# Patient Record
Sex: Male | Born: 1955 | Race: White | Hispanic: No | Smoking: Never smoker
Health system: Southern US, Community
[De-identification: ages and names within clinical notes are randomized; demographics above are authoritative.]

## PROBLEM LIST (undated history)

## (undated) ENCOUNTER — Emergency Department (HOSPITAL_COMMUNITY): Disposition: A | Discharge status: Home / Self Care | Payer: Medicare Other

## (undated) DIAGNOSIS — R51 Headache: Secondary | ICD-10-CM

## (undated) DIAGNOSIS — I10 Essential (primary) hypertension: Secondary | ICD-10-CM

## (undated) DIAGNOSIS — F329 Major depressive disorder, single episode, unspecified: Secondary | ICD-10-CM

## (undated) DIAGNOSIS — R569 Unspecified convulsions: Secondary | ICD-10-CM

## (undated) DIAGNOSIS — F32A Depression, unspecified: Secondary | ICD-10-CM

## (undated) DIAGNOSIS — R519 Headache, unspecified: Secondary | ICD-10-CM

## (undated) DIAGNOSIS — Z87442 Personal history of urinary calculi: Secondary | ICD-10-CM

## (undated) DIAGNOSIS — K219 Gastro-esophageal reflux disease without esophagitis: Secondary | ICD-10-CM

## (undated) HISTORY — PX: JOINT REPLACEMENT: SHX530

## (undated) HISTORY — PX: ROTATOR CUFF REPAIR: SHX139

---

## 2002-06-30 ENCOUNTER — Encounter: Payer: Self-pay | Admitting: Emergency Medicine

## 2002-06-30 ENCOUNTER — Emergency Department (HOSPITAL_COMMUNITY): Admission: EM | Admit: 2002-06-30 | Discharge: 2002-06-30 | Payer: Self-pay | Admitting: Emergency Medicine

## 2002-07-12 ENCOUNTER — Encounter: Admission: RE | Admit: 2002-07-12 | Discharge: 2002-07-12 | Payer: Self-pay | Admitting: Internal Medicine

## 2002-07-12 ENCOUNTER — Encounter: Payer: Self-pay | Admitting: Internal Medicine

## 2002-08-16 ENCOUNTER — Encounter: Payer: Self-pay | Admitting: Emergency Medicine

## 2002-08-16 ENCOUNTER — Emergency Department (HOSPITAL_COMMUNITY): Admission: EM | Admit: 2002-08-16 | Discharge: 2002-08-16 | Payer: Self-pay | Admitting: Emergency Medicine

## 2003-11-27 ENCOUNTER — Ambulatory Visit (HOSPITAL_COMMUNITY): Admission: RE | Admit: 2003-11-27 | Discharge: 2003-11-27 | Payer: Self-pay | Admitting: Internal Medicine

## 2004-05-18 ENCOUNTER — Emergency Department (HOSPITAL_COMMUNITY): Admission: EM | Admit: 2004-05-18 | Discharge: 2004-05-18 | Payer: Self-pay | Admitting: Emergency Medicine

## 2004-09-24 ENCOUNTER — Ambulatory Visit (HOSPITAL_COMMUNITY): Admission: RE | Admit: 2004-09-24 | Discharge: 2004-09-24 | Payer: Self-pay | Admitting: Internal Medicine

## 2005-02-02 ENCOUNTER — Inpatient Hospital Stay (HOSPITAL_COMMUNITY): Admission: EM | Admit: 2005-02-02 | Discharge: 2005-02-08 | Payer: Self-pay | Admitting: Emergency Medicine

## 2005-02-05 ENCOUNTER — Ambulatory Visit: Payer: Self-pay | Admitting: Physical Medicine & Rehabilitation

## 2006-01-06 ENCOUNTER — Ambulatory Visit (HOSPITAL_COMMUNITY): Admission: RE | Admit: 2006-01-06 | Discharge: 2006-01-06 | Payer: Self-pay | Admitting: Internal Medicine

## 2006-03-23 ENCOUNTER — Emergency Department (HOSPITAL_COMMUNITY): Admission: EM | Admit: 2006-03-23 | Discharge: 2006-03-24 | Payer: Self-pay | Admitting: Emergency Medicine

## 2006-03-29 ENCOUNTER — Encounter: Admission: RE | Admit: 2006-03-29 | Discharge: 2006-03-29 | Payer: Self-pay | Admitting: Otolaryngology

## 2006-03-30 ENCOUNTER — Encounter: Admission: RE | Admit: 2006-03-30 | Discharge: 2006-03-30 | Payer: Self-pay | Admitting: Otolaryngology

## 2008-12-10 ENCOUNTER — Ambulatory Visit (HOSPITAL_COMMUNITY): Admission: RE | Admit: 2008-12-10 | Discharge: 2008-12-10 | Payer: Self-pay | Admitting: Internal Medicine

## 2009-02-01 ENCOUNTER — Emergency Department (HOSPITAL_COMMUNITY): Admission: EM | Admit: 2009-02-01 | Discharge: 2009-02-01 | Payer: Self-pay | Admitting: Emergency Medicine

## 2009-03-15 ENCOUNTER — Emergency Department (HOSPITAL_COMMUNITY): Admission: EM | Admit: 2009-03-15 | Discharge: 2009-03-15 | Payer: Self-pay | Admitting: Emergency Medicine

## 2009-12-01 ENCOUNTER — Emergency Department (HOSPITAL_COMMUNITY): Admission: EM | Admit: 2009-12-01 | Discharge: 2009-12-01 | Payer: Self-pay | Admitting: Emergency Medicine

## 2010-05-04 ENCOUNTER — Emergency Department (HOSPITAL_COMMUNITY): Admission: EM | Admit: 2010-05-04 | Discharge: 2010-05-04 | Payer: Self-pay | Admitting: Emergency Medicine

## 2010-11-28 ENCOUNTER — Emergency Department (HOSPITAL_COMMUNITY)
Admission: EM | Admit: 2010-11-28 | Discharge: 2010-11-28 | Payer: Self-pay | Source: Home / Self Care | Admitting: Emergency Medicine

## 2010-12-07 ENCOUNTER — Encounter (INDEPENDENT_AMBULATORY_CARE_PROVIDER_SITE_OTHER): Payer: Self-pay | Admitting: Internal Medicine

## 2010-12-07 LAB — CONVERTED CEMR LAB
Albumin: 4.3 g/dL (ref 3.5–5.2)
BUN: 7 mg/dL (ref 6–23)
CO2: 27 meq/L (ref 19–32)
Calcium: 9.4 mg/dL (ref 8.4–10.5)
Chloride: 97 meq/L (ref 96–112)
Glucose, Bld: 108 mg/dL — ABNORMAL HIGH (ref 70–99)
Hemoglobin: 16 g/dL (ref 13.0–17.0)
Potassium: 3.7 meq/L (ref 3.5–5.3)
RBC: 4.8 M/uL (ref 4.22–5.81)
WBC: 5 10*3/uL (ref 4.0–10.5)

## 2011-01-08 ENCOUNTER — Ambulatory Visit: Admit: 2011-01-08 | Payer: Self-pay | Admitting: Internal Medicine

## 2011-01-15 ENCOUNTER — Observation Stay (HOSPITAL_COMMUNITY)
Admission: EM | Admit: 2011-01-15 | Discharge: 2011-01-18 | Payer: Self-pay | Attending: Internal Medicine | Admitting: Internal Medicine

## 2011-01-15 ENCOUNTER — Emergency Department (HOSPITAL_BASED_OUTPATIENT_CLINIC_OR_DEPARTMENT_OTHER)
Admission: EM | Admit: 2011-01-15 | Discharge: 2011-01-15 | Disposition: A | Discharge status: Other Acute Inpatient Hospital | Payer: Self-pay | Source: Home / Self Care | Admitting: Emergency Medicine

## 2011-01-18 LAB — CBC
HCT: 41.6 % (ref 39.0–52.0)
Hemoglobin: 14.7 g/dL (ref 13.0–17.0)
MCH: 32.7 pg (ref 26.0–34.0)
MCHC: 35.3 g/dL (ref 30.0–36.0)
MCV: 92.7 fL (ref 78.0–100.0)
Platelets: 262 10*3/uL (ref 150–400)
RBC: 4.49 MIL/uL (ref 4.22–5.81)
RDW: 12.3 % (ref 11.5–15.5)
WBC: 10.3 10*3/uL (ref 4.0–10.5)

## 2011-01-18 LAB — POCT TOXICOLOGY PANEL: Opiates: POSITIVE

## 2011-01-18 LAB — DIFFERENTIAL
Basophils Absolute: 0 10*3/uL (ref 0.0–0.1)
Basophils Relative: 0 % (ref 0–1)
Eosinophils Absolute: 0.1 10*3/uL (ref 0.0–0.7)
Eosinophils Relative: 1 % (ref 0–5)
Lymphocytes Relative: 17 % (ref 12–46)
Lymphs Abs: 1.7 10*3/uL (ref 0.7–4.0)
Monocytes Absolute: 0.9 10*3/uL (ref 0.1–1.0)
Monocytes Relative: 9 % (ref 3–12)
Neutro Abs: 7.5 10*3/uL (ref 1.7–7.7)
Neutrophils Relative %: 73 % (ref 43–77)

## 2011-01-18 LAB — URINALYSIS, ROUTINE W REFLEX MICROSCOPIC
Hgb urine dipstick: NEGATIVE
Nitrite: NEGATIVE
Protein, ur: NEGATIVE mg/dL
Urobilinogen, UA: 0.2 mg/dL (ref 0.0–1.0)

## 2011-01-18 LAB — COMPREHENSIVE METABOLIC PANEL
ALT: 24 U/L (ref 0–53)
AST: 26 U/L (ref 0–37)
Albumin: 4.2 g/dL (ref 3.5–5.2)
Alkaline Phosphatase: 86 U/L (ref 39–117)
BUN: 24 mg/dL — ABNORMAL HIGH (ref 6–23)
CO2: 29 mEq/L (ref 19–32)
Calcium: 11.5 mg/dL — ABNORMAL HIGH (ref 8.4–10.5)
Chloride: 96 mEq/L (ref 96–112)
Creatinine, Ser: 1.3 mg/dL (ref 0.4–1.5)
GFR calc Af Amer: >60 mL/min (ref >60)
GFR calc non Af Amer: 58 mL/min — ABNORMAL LOW (ref >60)
Glucose, Bld: 137 mg/dL — ABNORMAL HIGH (ref 70–99)
Potassium: 3.9 mEq/L (ref 3.5–5.1)
Sodium: 136 mEq/L (ref 135–145)
Total Bilirubin: 1.8 mg/dL — ABNORMAL HIGH (ref 0.3–1.2)
Total Protein: 7.9 g/dL (ref 6.0–8.3)

## 2011-01-18 LAB — POCT CARDIAC MARKERS
CKMB, poc: 4.2 ng/mL (ref 1.0–8.0)
Myoglobin, poc: 217 ng/mL (ref 12–200)
Troponin i, poc: <0.05 ng/mL (ref 0.00–0.09)
Troponin i, poc: 0.06 ng/mL (ref 0.00–0.09)

## 2011-01-18 LAB — URINE MICROSCOPIC-ADD ON

## 2011-01-18 LAB — LIPASE, BLOOD: Lipase: 88 U/L (ref 23–300)

## 2011-01-19 LAB — MRSA PCR SCREENING: MRSA by PCR: NEGATIVE

## 2011-01-19 LAB — CARDIAC PANEL(CRET KIN+CKTOT+MB+TROPI)
CK, MB: 2.4 ng/mL (ref 0.3–4.0)
Relative Index: INVALID (ref 0.0–2.5)
Total CK: 109 U/L (ref 7–232)
Total CK: 89 U/L (ref 7–232)
Troponin I: 0.01 ng/mL (ref 0.00–0.06)
Troponin I: <0.01 ng/mL (ref 0.00–0.06)
Troponin I: 0.02 ng/mL (ref 0.00–0.06)

## 2011-01-19 LAB — BASIC METABOLIC PANEL
CO2: 28 mEq/L (ref 19–32)
Calcium: 8.4 mg/dL (ref 8.4–10.5)
Chloride: 103 mEq/L (ref 96–112)
Creatinine, Ser: 0.83 mg/dL (ref 0.4–1.5)
GFR calc Af Amer: >60 mL/min (ref >60)
GFR calc Af Amer: >60 mL/min (ref >60)
GFR calc non Af Amer: >60 mL/min (ref >60)
Potassium: 3.7 mEq/L (ref 3.5–5.1)
Potassium: 4 mEq/L (ref 3.5–5.1)
Sodium: 136 mEq/L (ref 135–145)
Sodium: 136 mEq/L (ref 135–145)

## 2011-01-19 LAB — LIPID PANEL
Total CHOL/HDL Ratio: 2.6 RATIO
VLDL: 8 mg/dL (ref 0–40)

## 2011-01-19 LAB — PTH, INTACT AND CALCIUM: Calcium, Total (PTH): 8.9 mg/dL (ref 8.4–10.5)

## 2011-01-19 LAB — ALKALINE PHOSPHATASE: Alkaline Phosphatase: 47 U/L (ref 39–117)

## 2011-01-19 LAB — PHOSPHORUS: Phosphorus: 3.6 mg/dL (ref 2.3–4.6)

## 2011-01-25 NOTE — Consult Note (Addendum)
NAMEMAGUIRE, SIME             ACCOUNT NO.:  [id number]  MEDICAL RECORD NO.:  [id number]          PATIENT TYPE:  INP  LOCATION:  2041                         FACILITY:  [facility]  PHYSICIAN:  Jesse Sans. Wall, MD, FACCDATE OF BIRTH:  11-13-1956  DATE OF CONSULTATION:  01/17/2011 DATE OF DISCHARGE:                                CONSULTATION   PRIMARY CARDIOLOGIST:  Rainey Pines. Daleen Squibb, MD, [facility]  PRIMARY CARE PHYSICIAN:  Dr. Su Hilt.  REASON FOR CONSULTATION:  Chest pain.  HISTORY OF PRESENT ILLNESS:  This is a 55 year old Caucasian male without prior cardiac history with a 4-day history of "knife-like pain" midsternal while at home, lasting 10 minutes at a time associated with hiccups and occasional vomiting.  He states the vomiting made the chest pain better.  The pain returned 3-4 times an hour, laying down made it worse.  The following day the patient had dyspnea with diaphoresis. That afternoon he felt some better, but when he laid down at night the pain returned this time with radiation into his back with hiccups and nausea and vomiting.  The patient states the pain worsened the following day and his son brought him to the emergency room at [facility].  He was given sublingual nitroglycerin and pain medicine and the pain did not completely go away.  Therefore, he was transferred to [facility] for further evaluation.  The patient states now that he has had no more back pain or hiccups, but he continues to have 7/10 chest pain.  REVIEW OF SYSTEMS:  Positive for chest pain, shortness of breath radiating to the back, knife-like with nausea, vomiting, and hiccups. All other systems were reviewed and found to be negative.  PAST MEDICAL HISTORY: 1. Hypercalcemia. 2. Hypertension. 3. Avascular necrosis of the right hip. 4. Arthritis of the lower back and hip on the right. 5. Kidney stones.  PAST SURGICAL HISTORY:  Left rotator cuff repair, 2 hip surgeries on the right, and  lithotripsy secondary to kidney stones.  SOCIAL HISTORY:  He does not smoke.  He drank heavily in the past but only drinks maybe once a week now.  No drug use.  He lives in [place] with his daughter.  He has 2 children.  FAMILY HISTORY:  Mother deceased from complications of an MI with a history of a CVA.  Father had an MI at age 45 but he was deceased from a CVA.  He has a brother with hypertension.  CURRENT MEDICATIONS AT HOME: 1. Hydrochlorothiazide 25 mg daily. 2. Citalopram 20 mg daily. 3. Trazodone 50 mg daily. 4. Morphine 30 mg b.i.d. p.r.n.  ALLERGIES:  No known drug allergies.  CURRENT LABORATORY DATA:  Sodium 134, potassium 4.0, chloride 103, CO2 26, BUN 14, creatinine 0.83, and glucose 103.  Hemoglobin 14.7, hematocrit 41.6, white blood cells 10.3, and platelets 262.  Total bili 1.8, alkaline phosphatase 47, AST 26, ALT 24, total protein 7.9, and albumin 4.2.  TSH 1.48.  MRSA negative.  Troponin 0.01, 0.02, and 0.01 respectively.  Calcium 11.5 with a phosphorus of 3.6.  RADIOLOGY:  Chest x-ray normal and stable.  CT scan of the chest  negative for PE, small bilateral pleural effusions with overlying compressive type atelectasis.  EKG revealing sinus tachycardia, rate of 115 beats per minute with no ST changes.  PHYSICAL EXAMINATION:  VITAL SIGNS:  Blood pressure 130/83, pulse 89, respirations 18, temperature 97.5, O2 sat 98% on room air. GENERAL:  He is awake, alert, and oriented.  No acute distress. HEENT:  Head is normocephalic and atraumatic.  Eyes:  PERRLA. NECK:  Supple without JVD or carotid bruits appreciated. CARDIOVASCULAR:  Tachycardic without rubs or gallops.  Pulses are 2+ and equal. LUNGS:  Essentially clear to auscultation with mildly diminished in the bases. ABDOMEN:  Soft, nontender with negative Murphy sign. EXTREMITIES:  Without clubbing, cyanosis, or edema. MUSCULOSKELETAL:  No joint deformity or effusions. NEURO:  Cranial nerves II-XII are  grossly intact.  IMPRESSION: 1. Chest pain described as "knife-like" radiating to the back with     associated dyspnea, nausea, vomiting, and hiccups.  He now feels     more comfortable but pain continues as 7/10 which comes and goes.     Cardiovascular risk factors in the family, history of hypertension,     and age.  EKG revealing tachycardia without ischemic changes.     Planned stress Myoview in the a.m. for diagnostic and prognostic     purposes.  Continue aspirin and begin low-dose beta-blocker     Lopressor 12.5 mg b.i.d. 2. Hypertension by history.  It is currently well controlled. 3. Chronic arthritis with pain control, using morphine at home. 4. Rule out gastroesophageal reflux disease or gastrointestinal     etiology if cardiac origin has been ruled out, esophageal spasms,     etc.  PLAN:  This is a 55 year old Caucasian male we are seeing for chest pain which he describes as knife-like radiating to his back with associated nausea, vomiting, and hiccups with no prior cardiac history.  Plan as above.  We will follow making further recommendations throughout hospital course.  On behalf of the physicians and providers of [facility], we would like to thank the [facility] for allowing [place] to participate in the care of this patient.     Bettey Mare. Lyman Bishop, NP   ______________________________ Jesse Sans Daleen Squibb, MD, [facility]    KML/MEDQ  D:  01/17/2011  T:  01/18/2011  Job:  [id number]  cc:   [facility] Dr. Su Hilt  Electronically Signed by Joni Reining NP on 01/18/2011 04:54:05 PM Electronically Signed by Valera Castle MD [facility] on 01/25/2011 11:00:52 AM

## 2011-01-26 ENCOUNTER — Inpatient Hospital Stay (HOSPITAL_COMMUNITY)
Admission: EM | Admit: 2011-01-26 | Discharge: 2011-02-02 | DRG: 312 | Disposition: A | Discharge status: Other Acute Inpatient Hospital | Payer: Self-pay | Attending: Internal Medicine | Admitting: Internal Medicine

## 2011-01-26 DIAGNOSIS — L0292 Furuncle, unspecified: Secondary | ICD-10-CM | POA: Diagnosis present

## 2011-01-26 DIAGNOSIS — F1120 Opioid dependence, uncomplicated: Secondary | ICD-10-CM | POA: Diagnosis present

## 2011-01-26 DIAGNOSIS — K2960 Other gastritis without bleeding: Secondary | ICD-10-CM | POA: Diagnosis present

## 2011-01-26 DIAGNOSIS — K3189 Other diseases of stomach and duodenum: Secondary | ICD-10-CM | POA: Diagnosis present

## 2011-01-26 DIAGNOSIS — Z7982 Long term (current) use of aspirin: Secondary | ICD-10-CM

## 2011-01-26 DIAGNOSIS — F3289 Other specified depressive episodes: Secondary | ICD-10-CM | POA: Diagnosis present

## 2011-01-26 DIAGNOSIS — R079 Chest pain, unspecified: Secondary | ICD-10-CM | POA: Diagnosis present

## 2011-01-26 DIAGNOSIS — F1020 Alcohol dependence, uncomplicated: Secondary | ICD-10-CM

## 2011-01-26 DIAGNOSIS — R443 Hallucinations, unspecified: Secondary | ICD-10-CM | POA: Diagnosis present

## 2011-01-26 DIAGNOSIS — L0293 Carbuncle, unspecified: Secondary | ICD-10-CM | POA: Diagnosis present

## 2011-01-26 DIAGNOSIS — I10 Essential (primary) hypertension: Secondary | ICD-10-CM | POA: Diagnosis present

## 2011-01-26 DIAGNOSIS — F329 Major depressive disorder, single episode, unspecified: Secondary | ICD-10-CM | POA: Diagnosis present

## 2011-01-26 DIAGNOSIS — R55 Syncope and collapse: Principal | ICD-10-CM | POA: Diagnosis present

## 2011-01-26 DIAGNOSIS — G8929 Other chronic pain: Secondary | ICD-10-CM | POA: Diagnosis present

## 2011-01-27 LAB — VITAMIN B12: Vitamin B-12: 249 pg/mL (ref 211–911)

## 2011-01-27 LAB — PHOSPHORUS: Phosphorus: 3.9 mg/dL (ref 2.3–4.6)

## 2011-01-27 LAB — LIPID PANEL
Cholesterol: 149 mg/dL (ref 0–200)
HDL: 56 mg/dL (ref >39)
Triglycerides: 70 mg/dL (ref <150)

## 2011-01-27 LAB — MAGNESIUM
Magnesium: 1.9 mg/dL (ref 1.5–2.5)
Magnesium: 1.9 mg/dL (ref 1.5–2.5)

## 2011-01-27 LAB — COMPREHENSIVE METABOLIC PANEL
AST: 14 U/L (ref 0–37)
CO2: 25 mEq/L (ref 19–32)
Calcium: 8.9 mg/dL (ref 8.4–10.5)
Creatinine, Ser: 0.83 mg/dL (ref 0.4–1.5)
GFR calc Af Amer: >60 mL/min (ref >60)
GFR calc non Af Amer: >60 mL/min (ref >60)

## 2011-01-27 LAB — CK TOTAL AND CKMB (NOT AT ARMC)
CK, MB: 2.1 ng/mL (ref 0.3–4.0)
Relative Index: INVALID (ref 0.0–2.5)

## 2011-01-27 LAB — CARDIAC PANEL(CRET KIN+CKTOT+MB+TROPI)
CK, MB: 1.3 ng/mL (ref 0.3–4.0)
Relative Index: INVALID (ref 0.0–2.5)
Total CK: 26 U/L (ref 7–232)
Troponin I: 0.02 ng/mL (ref 0.00–0.06)

## 2011-01-27 LAB — HEMOGLOBIN A1C
Hgb A1c MFr Bld: 5.2 % (ref <5.7)
Mean Plasma Glucose: 103 mg/dL (ref <117)
Mean Plasma Glucose: 103 mg/dL (ref <117)

## 2011-01-27 LAB — CBC
Platelets: 377 10*3/uL (ref 150–400)
RBC: 4.07 MIL/uL — ABNORMAL LOW (ref 4.22–5.81)
RDW: 13.3 % (ref 11.5–15.5)
WBC: 5.6 10*3/uL (ref 4.0–10.5)

## 2011-01-28 LAB — FOLATE RBC: RBC Folate: 468 ng/mL (ref 180–600)

## 2011-01-29 LAB — PHOSPHORUS: Phosphorus: 2.9 mg/dL (ref 2.3–4.6)

## 2011-01-29 LAB — BASIC METABOLIC PANEL
BUN: 12 mg/dL (ref 6–23)
Chloride: 100 mEq/L (ref 96–112)
GFR calc non Af Amer: >60 mL/min (ref >60)
Potassium: 4.2 mEq/L (ref 3.5–5.1)
Sodium: 135 mEq/L (ref 135–145)

## 2011-01-29 LAB — CBC
MCV: 95.2 fL (ref 78.0–100.0)
Platelets: 380 10*3/uL (ref 150–400)
RBC: 4.36 MIL/uL (ref 4.22–5.81)
RDW: 12.7 % (ref 11.5–15.5)
WBC: 9.6 10*3/uL (ref 4.0–10.5)

## 2011-01-29 LAB — MAGNESIUM: Magnesium: 2 mg/dL (ref 1.5–2.5)

## 2011-02-02 DIAGNOSIS — F1010 Alcohol abuse, uncomplicated: Secondary | ICD-10-CM

## 2011-02-06 NOTE — Discharge Summary (Signed)
[name], [name]             ACCOUNT NO.:  [id number]  MEDICAL RECORD NO.:  [id number]           PATIENT TYPE:  I  LOCATION:  3739                         FACILITY:  [facility]  PHYSICIAN:  Charlestine Massed, MDDATE OF BIRTH:  08-Oct-1956  DATE OF ADMISSION:  01/26/2011 DATE OF DISCHARGE:                              DISCHARGE SUMMARY   PRIMARY CARE PHYSICIAN:  [facility].  DISCHARGE DIAGNOSES: 1. Acute gastritis with acid peptic disease - continue on PPIs, no     further GI workup needed as per GI - prior history of grade C     erosive esophagitis. 2. Alcohol abuse and alcohol dependence, currently completed detox in     hospital. 3. Opioid dependence and abuse - snorting of morphine - currently on     methadone protocol at a dose of 15 mg p.o. daily. 4. Question of course of early psychosis with hallucinations,     currently hallucination resolved. 5. Chronic depression and chronic pain syndrome.  DISCHARGE MEDICATIONS: 1. Acetaminophen 650 mg p.o. q.4 hourly p.r.n. for pain. 2. Enteric-coated aspirin 81 mg p.o. daily with breakfast. 3. Colace 100 mg p.o. b.i.d. 4. Folic acid 1 mg p.o. daily. 5. Methadone 15 mg p.o. daily - to be continued on a slow taper     protocol as tolerated by the detox program.  Continue 50 mg until     then. 6. Multivitamins 1 tablet p.o. daily. 7. Protonix 40 mg p.o. b.i.d. 8. Thiamine 100 mg p.o. daily. 9. Celexa 20 mg p.o. daily. 10.Trazodone 50 mg p.o. q.h.s. p.r.n.  The following medications have been discontinued, 1. Morphine sulfate 30 b.i.d. as needed. 2. Metoprolol 12.5 b.i.d. has been discontinued.  HOSPITAL COURSE: 1. Gastritis.  The patient has prior history of esophagitis as per     consult from Dr. Madilyn Fireman.  He has continued to drink alcohol and     continuously has the pain.  He has been on Protonix and there has     been no further GI workup needed. 2. Alcohol and opioid dependence.  He completed Ativan protocol and  currently he is off.  He does not have any withdrawal symptoms of     alcohol.  He was placed on methadone and currently he is on 15 mg     dose of methadone daily.  He needs to be followed at the detox     program for further tapering of the dose as tolerated.  He has     chronic pain issues, which I believe are mainly due to depression     and his low back pain issues.  Dosage needs to be titrated     accordingly by the detox program.  The patient has agreed for     outpatient detox and has called the [facility] program and awaiting bed     assignment.  DISPOSITION:  Discharged to [facility] program for rehab from opioid and alcohol and ongoing rehab.  SUBJECTIVE:  The patient is in exam today, not in any distress, afebrile.  OBJECTIVE:  VITAL SIGNS:  Temperature 98.3, heart rate 84, respirations 18, blood pressure 106/71, and O2 sat  99%. GENERAL:  The patient is awake and alert. HEAD AND NECK:  No JVD.  No bruit.  No ophthalmoplegia.  Pupils 2-3 mm bilaterally reactive to light. CHEST:  Bilateral air entry good anteriorly and posteriorly.  No rales or wheeze heard. CARDIAC:  S1 and S2 are regular.  No murmurs. ABDOMEN:  Soft, nontender.  No organomegaly. EXTREMITIES:  No pedal edema. CNS:  No obvious motor or sensory deficits.  No tremors.  No gait abnormalities.  ASSESSMENT AND PLAN:  As dictated above.     Charlestine Massed, MD     UT/MEDQ  D:  02/02/2011  T:  02/02/2011  Job:  [id number]  cc:   Eulogio Ditch, MD  Electronically Signed by Charlestine Massed MD on 02/06/2011 11:46:26 AM

## 2011-02-06 NOTE — H&P (Signed)
NAMECONOR, LATA             ACCOUNT NO.:  [id number]  MEDICAL RECORD NO.:  [id number]          PATIENT TYPE:  EMS  LOCATION:  MAJO                         FACILITY:  [facility]  PHYSICIAN:  Michiel Cowboy, MDDATE OF BIRTH:  [birth year]  DATE OF ADMISSION:  01/26/2011 DATE OF DISCHARGE:                             HISTORY & PHYSICAL   PRIMARY CARE PROVIDER:  [facility].  CHIEF COMPLAINT:  Presyncope and chest pains.  The patient is a 55 year old gentleman with a history of chronic back pain for which he states he is on morphine p.o.  He has had over the past few weeks recurrent episodes of presyncope and recurrent chest pains for which he has been evaluated in detail 1 week ago.  He was admitted, his cardiac markers were cycled, which were unremarkable.  He had a negative stress test done and a negative CT scan of the chest to rule out PE and dissection.  He comes back again because he continues to feel occasionally lightheaded and particularly when he stands up.  He also says that he continues to have severe back pain.  He says that he is running out of his morphine.  I am not sure when he received this last.  He states that his old physician has given it to him.  The patient has been endorsing that he feels lightheaded whenever he stands up, sometimes even falling down onto his knees, although never completely lost consciousness.  He feels better once he is in the sitting position.  Sometimes, this is associated with chest pain.  Of note, the patient had had last week an episode of nausea and vomiting of black substance.  I am not sure if it has been worked up.  REVIEW OF SYSTEMS:  No fevers, no chills.  Chest pains are occasional, he gets of about 5-6 per day.  They are sharp and short lasting about 15 seconds in the middle of his chest, nonpleuritic and not associated with shortness of breath.  Otherwise, review of systems is negative.  PAST MEDICAL HISTORY:   Significant for: 1. Depression. 2. Hypertension. 3. Chronic pain syndrome. 4. Alcohol abuse in the past.  He states that he is currently drinking     much less.  SOCIAL HISTORY:  The patient is a former heavy drinker, states that he did have one drink a day and 3 days ago, he had one drink as well, but otherwise he tries to limit himself to only one drink per week.  He denies drug abuse, but his Utox is positive for cocaine and not positive for opioids.  FAMILY HISTORY:  Noncontributory.  ALLERGIES:  None.  MEDICATIONS: 1. Citalopram 20 mg daily. 2. Protonix 40 mg daily. 3. Aspirin 325 mg a day. 4. The patient was discharged on metoprolol 12.5 mg t.i.d. 5. Hydrochlorothiazide 25 mg daily. 6. The patient is supposed to be on morphine CR p.o. 30 mg p.o.     b.i.d., but again I cannot confirm this.  I do not have his full     bottles with me.  His Utox is negative for opioids.  PHYSICAL EXAMINATION:  VITAL SIGNS:  Temperature 97.5, blood pressure 133/88, pulse 82, respirations 18, satting 99% on room air. GENERAL:  The patient appears to be in no acute distress. HEAD:  Nontraumatic.  Moist mucous membranes, but somewhat decreased skin turgor. LUNGS:  Clear to auscultation bilaterally. HEART:  Regular rate and rhythm, but somewhat rapid. ABDOMEN:  Soft, nontender, nondistended. EXTREMITIES:  Without clubbing, cyanosis, or edema. NEUROLOGIC:  Cranial nerves II through XII intact.  Strength 5/5 in all 4 extremities.  Neurologically grossly intact. SKIN:  On the back, significant for two sites that is consistent with boil formation, one of them seems to be fluctuant and could be possibly excised.  Abdomen has slight reddening of the skin.  This could be a rash.  This has been going on for about a day now.  He does have fine tremor, but no asterixis.  LABORATORY DATA:  White blood cell count 9.3, hemoglobin 14.8.  Sodium 142, potassium 3.9, chloride 0.9.  LFTs within normal  limits.  Albumin 3.9, troponin slightly elevated at 0.1 this is on i-STAT.  Alcohol level less than 5.  CT scan of the head is unremarkable.  Chest x-ray show mild atelectasis, but otherwise unremarkable.  He had a normal stress test on January 18, 2011, and a normal CT scan of the chest.  No PE about 10 days ago.  EKG did not show any ischemic changes.  He is positive for orthostasis.  Utox positive for cocaine, negative for opioids.  ASSESSMENT AND PLAN:  Presyncope likely secondary to mild dehydration and orthostasis.  We will cycle cardiac markers again especially given slightly elevated troponin, which is on i-STAT, placed him on telemetry, give IV fluids. The patient did have a resting echogram done.  We will hold his metoprolol and allowing him take his cocaine.  Chest pain, this is very atypical.  I wonder if this is cocaine related. He has had negative stress test recently.  We will continue to follow his troponin.  If positive, may consider Cardiology consult, but as I said I wonder if this is all cocaine-related chest pain.  Avoid beta- blockers.  Hypertension.  I wonder if it somewhat overtreated.  We will hold hydrochlorothiazide and hold beta-blocker.  We can give Norvasc instead withholding parameters.  Boils.  Informed ED physician about the location and will see if we can do I and D, meanwhile put him on doxycycline and warm compresses.  Prophylaxis.  Protonix and SCDs.  The patient had a vomiting of dark substance last week.  He has not repeated since then.  I am not sure what they thought that was, but we will send Hemoccult stools, and we will try to avoid Lovenox or heparin for now and make sure he is on Protonix.  Alcohol abuse.  Since the patient has not been on honest with me about use of his other substances, I am not so sure if I can trust his history about alcohol abuse.  He does have some tremor.  We will put him on CIWA protocol to be on the safe  side for now.     Michiel Cowboy, MD     AVD/MEDQ  D:  01/26/2011  T:  01/26/2011  Job:  [id number]  cc:   Melvern Banker  Electronically Signed by Therisa Doyne MD on 02/06/2011 07:26:31 PM

## 2011-02-09 NOTE — Discharge Summary (Signed)
  [name], [name]             ACCOUNT NO.:  [id number]  MEDICAL RECORD NO.:  [id number]          PATIENT TYPE:  INP  LOCATION:  2041                         FACILITY:  [facility]  PHYSICIAN:  Tarry Kos, MD       DATE OF BIRTH:  1956-05-14  DATE OF ADMISSION:  01/15/2011 DATE OF DISCHARGE:  01/18/2011                              DISCHARGE SUMMARY   DISCHARGE DIAGNOSES: 1. Atypical chest pain. 2. Chronic pain syndrome. 3. Hypertension. 4. Hiccups.  SUMMARY OF HOSPITAL COURSE:  [name] is a 54-year male presented to the emergency room with atypical chest pain.  He was admitted, had serial cardiac enzymes which were all negative.  He had a cardiology evaluation who recommended a Lexiscan and stress test.  He has gotten that this morning, those results are pending.  Preliminary report is negative thus far.  He also will have a 2-D echo done.  He also had a CTA of his chest which was negative for pulmonary emboli.  Chest x-ray was normal.  He is afebrile.  Vital signs stable.  His electrolytes were normal.  TSH was normal.  PTH is pending.  The patient is being discharged home, if his stress testing is normal, on the following medications:  Aspirin 325 mg a day, Protonix 40 mg daily, metoprolol XL 12.5 mg twice a day.  Continue his chronic morphine 30 mg CR twice a day, trazodone 50 mg at bedtime, HCTZ 25 mg daily, citalopram 20 mg daily.  The patient is to follow up with his primary care physician in 1 week.  He has been chest pain free on admission.  PHYSICAL EXAMINATION:  VITAL SIGNS:  He has been afebrile.  Vital signs stable. GENERAL:  Alert and oriented x4, no apparent distress, cooperative fairly. CARDIAC:  Regular rate and rhythm without murmurs, rubs, or gallops. CHEST:  Clear to auscultation bilaterally.  No wheezes, rhonchi, or rales. ABDOMEN:  Soft, nontender, nondistended.  Positive bowel sounds.  No hepatosplenomegaly. EXTREMITIES:  No clubbing, cyanosis,  or edema.  Again, he is to follow up with his primary care physician in 1 week.          ______________________________ Tarry Kos, MD     RD/MEDQ  D:  01/18/2011  T:  01/19/2011  Job:  [id number]  Electronically Signed by Eldridge Dace MD on 02/09/2011 01:29:08 PM

## 2011-03-04 NOTE — Consult Note (Signed)
  [name], [name]             ACCOUNT NO.:  [id number]  MEDICAL RECORD NO.:  [id number]           PATIENT TYPE:  I  LOCATION:  3713                         FACILITY:  [facility]  PHYSICIAN:  [name], M.D.    DATE OF BIRTH:  [birth year]  DATE OF CONSULTATION: DATE OF DISCHARGE:                                CONSULTATION   REASON FOR CONSULTATION:  Chest, back, and apparently epigastric pain.  HISTORY OF PRESENT ILLNESS:  The patient is a 55 year old white male admitted with chronic back pain, chest pain, and presyncope.  Apparently described some epigastric pain yesterday, but denies any today.  He states his back hurts when he is having pain in his hips.  Denies any nausea, vomiting or dysphagia.  I did an EGD on him in 2010, which showed grade C erosive esophagitis.  The patient was apparently on Protonix on admission, but it is not clear whether he was taking it, and I do not think he was taking it prior to an admission on January 21st.  PAST MEDICAL HISTORY:  Hypertension, arthritis, and chronic low back pain.  SURGERIES:  Rotator cuff repair and right hip surgery.  MEDICATIONS: 1. Hydrochlorothiazide. 2. Citalopram. 3. Lisinopril.  ALLERGIES:  None.  SOCIAL HISTORY:  The patient denies alcohol or tobacco use.  PHYSICAL EXAMINATION:  GENERAL:  Somewhat lethargic white male in no acute distress, sitting up and eating a full breakfast. HEART:  Regular rate and rhythm without murmur. LUNGS:  Clear. ABDOMEN:  Soft, nondistended with normoactive bowel sounds.  No hepatosplenomegaly, mass or guarding.  IMPRESSION:  Back and chest pain with apparently some epigastric pain noted.  Grade C erosive esophagitis documented in the past.  PLAN:  I suspect there is probably some component of gastroesophageal reflux that has not been adequately treated.  Would simply resume a proton pump inhibitor b.i.d. for at least 2 months and then daily thereafter.  No further GI  workup indicated at the moment.          ______________________________ Everardo All. Madilyn Olsen, M.D.     JCH/MEDQ  D:  01/28/2011  T:  01/28/2011  Job:  [id number]  Electronically Signed by Dorena Cookey M.D. on 03/02/2011 07:07:05 PM

## 2011-03-17 LAB — DIFFERENTIAL
Basophils Absolute: 0 10*3/uL (ref 0.0–0.1)
Basophils Relative: 0 % (ref 0–1)
Eosinophils Absolute: 0.1 10*3/uL (ref 0.0–0.7)
Lymphs Abs: 1.8 10*3/uL (ref 0.7–4.0)
Neutrophils Relative %: 73 % (ref 43–77)

## 2011-03-17 LAB — APTT: aPTT: 26 seconds (ref 24–37)

## 2011-03-17 LAB — POCT CARDIAC MARKERS
CKMB, poc: <1 ng/mL — ABNORMAL LOW (ref 1.0–8.0)
Myoglobin, poc: 45.8 ng/mL (ref 12–200)
Troponin i, poc: 0.1 ng/mL — ABNORMAL HIGH (ref 0.00–0.09)

## 2011-03-17 LAB — COMPREHENSIVE METABOLIC PANEL
Albumin: 3.9 g/dL (ref 3.5–5.2)
BUN: 6 mg/dL (ref 6–23)
Creatinine, Ser: 0.89 mg/dL (ref 0.4–1.5)
Total Protein: 7.2 g/dL (ref 6.0–8.3)

## 2011-03-17 LAB — CBC
MCV: 93.7 fL (ref 78.0–100.0)
Platelets: 371 10*3/uL (ref 150–400)
RBC: 4.45 MIL/uL (ref 4.22–5.81)
WBC: 9.3 10*3/uL (ref 4.0–10.5)

## 2011-03-17 LAB — D-DIMER, QUANTITATIVE: D-Dimer, Quant: <0.22 ug/mL-FEU (ref 0.00–0.48)

## 2011-03-17 LAB — PROTIME-INR: Prothrombin Time: 13.3 seconds (ref 11.6–15.2)

## 2011-03-17 LAB — ETHANOL: Alcohol, Ethyl (B): <5 mg/dL (ref 0–10)

## 2011-03-30 NOTE — H&P (Signed)
[name], [name]             ACCOUNT NO.:  [id number]  MEDICAL RECORD NO.:  [id number]           PATIENT TYPE:  LOCATION:                                 FACILITY:  PHYSICIAN:  Della Goo, M.D. DATE OF BIRTH:  [birth year]  DATE OF ADMISSION:  01/16/2011 DATE OF DISCHARGE:                             HISTORY & PHYSICAL   PRIMARY CARE PHYSICIAN:  Dr. Su Hilt.  CHIEF COMPLAINT:  Chest pain.  HISTORY OF PRESENT ILLNESS:  This is a 55 year old male, who was seen at the [facility] Emergency Department on January 15, 2011, presenting with complaints of chest pain off and on for the past 2 days. The patient also reports having pain into his back.  He described the pain as being stabbing, mid chest, and he has associated symptoms of shortness of breath and nausea.  He denies having any diaphoresis.  The patient also reports having cough, and he has had hiccups for the past 3 days.  He denies having any fevers, chills.  He denies having any cough production.  The patient does have a strong family history of coronary artery disease.  Father having an MI in his 62s.  The patient was seen in the emergency department at [facility], and his ER workup included negative point-of-care markers and a normal EKG.  The patient's laboratory studies, however, did reveal an elevated calcium level at 11.6.  The patient was referred for medical admission.  The patient also had symptoms of nausea and vomiting associated with this chest discomfort.  He rated this pain as being at 10/10 at the worse.  PAST MEDICAL HISTORY:  Significant for hypertension, arthritis.  He has chronic low back pain.  PAST SURGICAL HISTORY:  Left rotator cuff repair, right hip surgery x2, the first one due to avascular necrosis, the second hip surgery secondary to a hip fracture.  MEDICATIONS:  Two blood pressure medications , and morphine.  The patient takes hydrochlorothiazide, citalopram,  and lisinopril.  ALLERGIES:  No known drug allergies.  SOCIAL HISTORY:  The patient is nonsmoker, nondrinker.  No history of illicit drug usage.  FAMILY HISTORY:  Positive for coronary artery disease in his father.  REVIEW OF SYSTEMS:  Pertinents mentioned above.  PHYSICAL EXAMINATION FINDINGS:  GENERAL:  This is a pleasant 55 year old well-nourished, well-developed Caucasian male in discomfort, but no acute distress. VITAL SIGNS:  Temperature 98.0, blood pressure 140/91, heart rate 115- 98, respirations 24, now 17, O2 sats are 100%. HEENT:  Normocephalic, atraumatic.  Pupils equally round, reactive to light.  Extraocular movements are intact.  Funduscopic benign.  There is no scleral icterus.  Nares are patent bilaterally.  Oropharynx is clear. NECK:  Supple.  Full range of motion.  No thyromegaly, adenopathy, jugular venous distention. CARDIOVASCULAR:  Regular rate and rhythm.  No murmurs, gallops, or rubs appreciated. LUNGS:  Clear to auscultation bilaterally.  No rales, rhonchi, or wheezes ABDOMEN:  Positive bowel sounds, soft, nontender, nondistended.  No hepatosplenomegaly. EXTREMITIES:  Without cyanosis, clubbing, or edema. NEUROLOGIC:  The patient is alert and oriented x3.  Cranial nerves are intact.  There are no focal deficits  on examination.  LABORATORY STUDIES:  White blood cell count 10.3, hemoglobin 14.7, hematocrit 41.6, MCV 92.7, platelets 262, neutrophils 73%, lymphocytes 17%.  Sodium 136, potassium 3.9, chloride 96, CO2 of 29, BUN 24, creatinine 1.3, glucose 137, albumin 4.2, AST 26, lipase 88 and the range is 23-300.  Urinalysis was small leukocyte esterase.  Urine drug screen positive for opiates, which the patient is prescribed.  Point-of- care cardiac markers with a myoglobin 217, CK-MB 4.2, troponin less than 0.05.  The second set myoglobin 179, CK-MB 4.7, troponin 0.06.  Chest x- ray reveals clear lung fields.  No infiltrates or effusions.  ASSESSMENT:   EKG reveals a sinus tachycardia, no acute ST segment changes were seen at a rate of 115.  ASSESSMENT:  A 55 year old male being admitted with: 1. Chest pain. 2. Hypercalcemia. 3. Hypertension. 4. Hiccups. 5. Transient hypotension secondary to medications.  PLAN:  The patient has been admitted to step-down ICU area.  Cardiac enzymes will be performed.  The patient has been placed on Nitropaste, aspirin therapy at this time.  Hold parameters have been written for hypotension for the Nitropaste not to be given if hypotensive.  A workup will also be initiated for the hypercalcemia, a PTH level, alkaline phosphatase level, TSH, phosphorus level, and ionized calcium level will be checked.  Pending these results, imaging studies may be needed to locate a possible neoplastic process.  The patient's regular medications will be further verified.  His hydrochlorothiazide therapy will be held at this time secondary to his hypercalcemia, and IV fluids have been ordered for rehydration therapy. Thorazine has also been ordered x1 dose for his hiccups.  IV Protonix therapy and DVT prophylaxis have also been ordered.  The patient is a full code at this time.     Della Goo, M.D.     HJ/MEDQ  D:  01/16/2011  T:  01/16/2011  Job:  [id number]  Electronically Signed by Della Goo M.D. on 03/01/2011 07:03:36 PM

## 2011-05-14 NOTE — Discharge Summary (Signed)
[name], [name]             ACCOUNT NO.:  [id number]   MEDICAL RECORD NO.:  [id number]          PATIENT TYPE:  INP   LOCATION:  5023                         FACILITY:  [facility]   PHYSICIAN:  Nadara Mustard, MD     DATE OF BIRTH:  [birth year]   DATE OF ADMISSION:  02/02/2005  DATE OF DISCHARGE:  02/08/2005                                 DISCHARGE SUMMARY   DIAGNOSIS:  Right subtrochanteric femur fracture status post free  vascularized fibula.   PROCEDURE:  Removal of free fibula, right total hip arthroplasty with open  reduction internal fixation right femur.  Discharged to home in stable  condition.   PLAN:  Follow-up in two weeks.  Prescriptions for Vicodin, Coumadin, and  Omnicef and instructions for [facility] with home health physical  therapy.   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  Patient initially underwent free  vascularized fibula treatment at [facility].  Patient subsequently was  walking and sustained a fracture through the drill holes of the femur  secondary to misstepping while going down the stairs.  Patient was admitted  with the proximal femur fracture status post free vascularized fibula.  Surgical options were discussed with the patient including internal fixation  and salvage of the free vascularized fibula versus conversion to total hip  arthroplasty with open reduction internal fixation of the femur.  Patient  states he wished to proceed with open reduction internal fixation of the  femur with conversion to a total hip arthroplasty.  Patient was initially  scheduled for surgical intervention on February 8.  However to elective  procedures running late, patient was rescheduled for February 9.  Patient's  hospital course was essentially unremarkable.  He underwent a right total  hip arthroplasty with open reduction internal fixation of the femur and  removal of the free  fibula on February 04, 2005.  He received DePuy ceramic  on ceramic components.  Postoperatively patient was treated with Kefzol for  infection prophylaxis and Coumadin for DVT prophylaxis.  His hemoglobin  dropped to 8.0.  He was typed and crossed and transfused with 2 units of  packed red blood cells.  Post transfusion his hemoglobin was 10.  Patient  progressed slowly with physical therapy.  He was discharged to home in  stable condition on February 13 with home health physical therapy and  instructions for touchdown weightbearing on the right.  Plan to follow up in  the office in two weeks.      MVD/MEDQ  D:  03/31/2005  T:  03/31/2005  Job:  [id number]

## 2011-05-14 NOTE — Op Note (Signed)
NAMEMAURO, ARPS             ACCOUNT NO.:  [id number]   MEDICAL RECORD NO.:  [id number]          PATIENT TYPE:  INP   LOCATION:  5023                         FACILITY:  [facility]   PHYSICIAN:  Nadara Mustard, MD     DATE OF BIRTH:  [birth year]   DATE OF PROCEDURE:  02/04/2005  DATE OF DISCHARGE:                                 OPERATIVE REPORT   PREOPERATIVE DIAGNOSES:  1.  Right subtrochanteric femur fracture.  2.  Avascular necrosis, femoral head, status post revascularized fibula.   PROCEDURES:  1.  Open reduction and internal fixation of the femur with 90 degree locking      plates.  2.  Removal of the free fibula.  3.  Right total hip arthroplasty with DePuy ceramic acetabulum and femoral      head, with the #10 femur, 52 mm acetabulum, 32 mm head, with a +1 neck.   SURGEON:  Nadara Mustard, MD   ASSISTANT:  Vanita Panda. Magnus Ivan, M.D.   ANESTHESIA:  General.   ESTIMATED BLOOD LOSS:  1500 mL with an intraoperative hemoglobin of 11.0.   ANTIBIOTICS:  1 g Kefzol.   DISPOSITION:  To PACU in stable condition.  He had good dorsiflexion of his  ankle upon leaving the room.   INDICATION FOR PROCEDURE:  The patient is a 55 year old gentleman who had  avascular necrosis of his right hip of unknown etiology.  The patient went  to [facility] and underwent a free vascularized fibula two months ago.  The  patient fell coming down some stairs and sustained a subtrochanteric femur  fracture.  I discussed with the patient the options, including open  reduction and internal fixation versus hemiarthroplasty versus total hip  arthroplasty.  The patient states he would like to proceed with total hip  arthroplasty.  Risks and benefits were discussed, including infection,  neurovascular injury, persistent pain, need for additional surgery, as well  as dislocation, DVT and pulmonary embolus.  The patient states he  understands and wishes to proceed at this time.   DESCRIPTION OF PROCEDURE:   The patient was brought to OR room 17 and  underwent a general anesthetic.  After adequate level of anesthesia  obtained, the patient was placed in the left lateral decubitus position with  the right side up and his right lower extremity was prepped using DuraPrep,  draped into a sterile field, and an [place] was used to cover all exposed  skin.  A posterolateral incision was made.  This was posterior to the  anterior incision that he had for the free fibula.  This carried down to the  tensor fascia lata, which was split.  The piriformis and short external  rotators were tagged, cut and retracted.  The capsule was T'd, tagged, cut  and retracted.  The femoral neck cut was made and the head was delivered.  Attention was first focused on the acetabulum.  The acetabulum was  sequentially reamed to 51 mm and a 52 mm cup was placed with a trial liner.  Attention was then focused on the femur.  The distal femoral canal  was  broached with the broaches up to a size 10 broach.  The proximal aspect of  the femur was also sequentially broached up to a 10.  The hip was then  reduced with the final #10 femoral implant.  With the implant in place and  the fracture reduced, the fracture was then stabilized with two 3.5 DC  locking plates and this was locked proximally and distally with three  locking screws, with two 90 degree plates.  A cerclage wire was also placed  around the neck fracture proximally.  The femoral head was used for bone  graft, and this was reamed with a 46 mm reamer and the bone graft was packed  around the femur.  The patient had a large defect laterally from where the  drill holes were made for the free vascularized fibula, and there were  essentially three drill holes making one large entry portal for the free  vascularized fibula, and this is the area that propagated the fracture.  The  final head was placed on the stem, the final acetabular ceramic liner was  placed.  The wound was  irrigated throughout the case.  The hip was reduced  and placed through a full range of motion.  He had 100 degrees of flection,  full abduction with internal rotation of 70 degrees.  He had full extension  and external rotation.  The leg lengths appeared equal.  The wound was  irrigated.  The short external rotators and the capsule was closed using #1  Ethibond.  The tensor fascia lata was closed using a running #1 Vicryl, the  subcu was closed using 2-0 Vicryl, the skin was closed using Proximate  staples.  The wound was covered with Adaptic, orthopedic sponges, ABD  dressing and Hypafix tape.  The patient was transferred supine, a knee  immobilizer was placed, and he was extubated and taken to the PACU in stable  condition.  He did have active dorsiflexion of the ankle upon leaving the  room.      MVD/MEDQ  D:  02/04/2005  T:  02/05/2005  Job:  [id number]

## 2012-02-02 IMAGING — CR DG LUMBAR SPINE COMPLETE 4+V
5 series · 5 of 5 positions shown · non-contrast
Comparison: 12/10/2008

CLINICAL DATA: Chronic low back pain

LUMBAR SPINE - COMPLETE 4+ VIEW

[t l-spine a.p.]
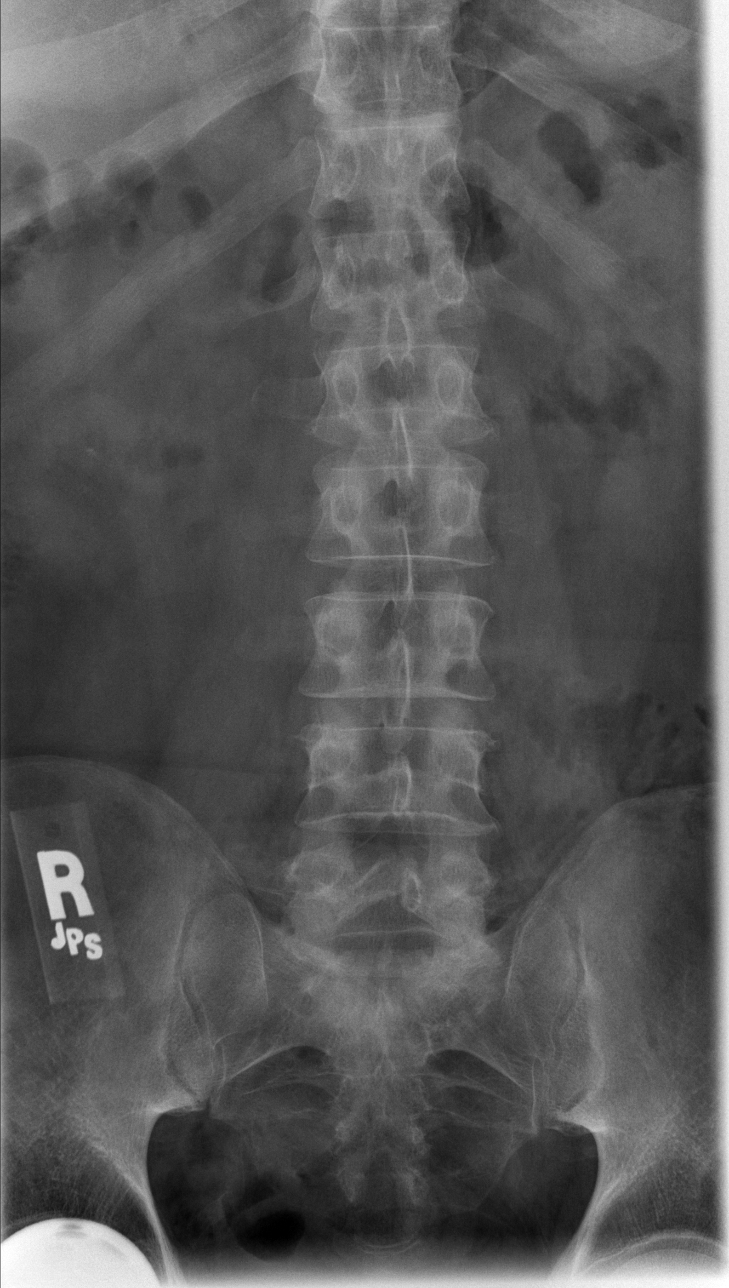

[t l-spine oblique exposure (1 of 2)]
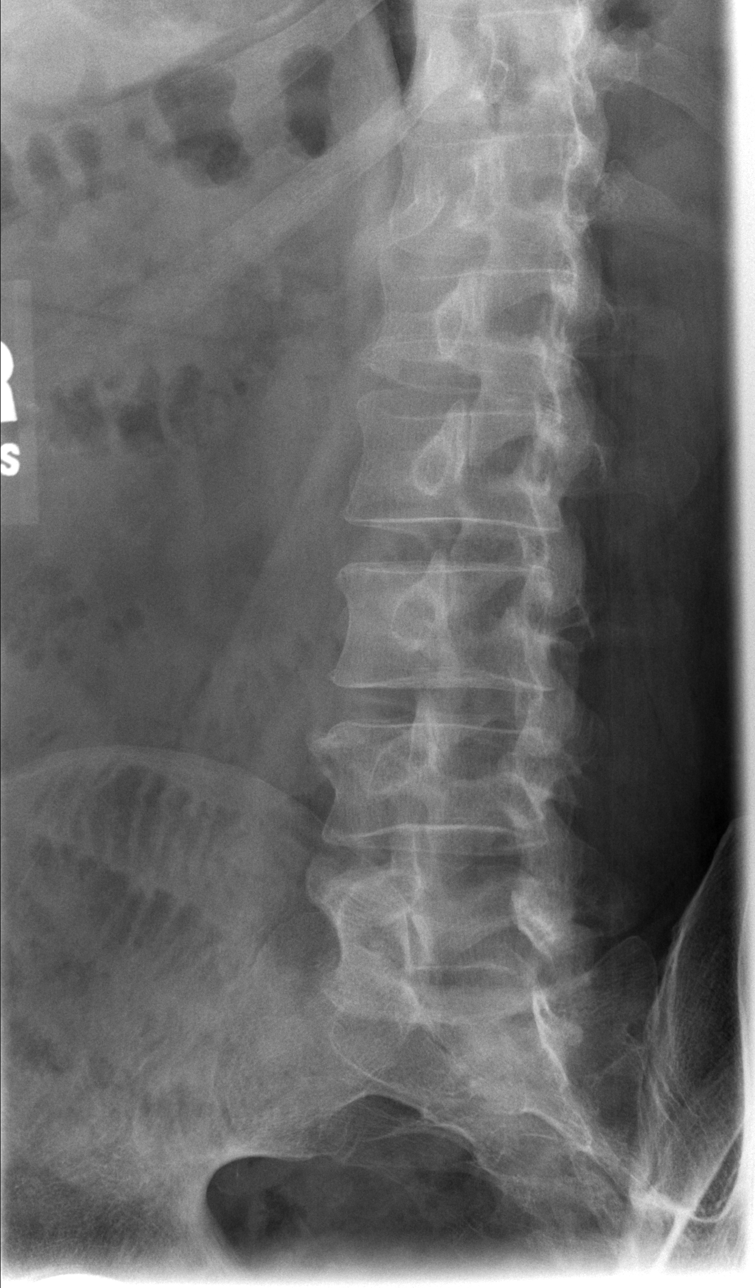

[t l-spine oblique exposure (2 of 2)]
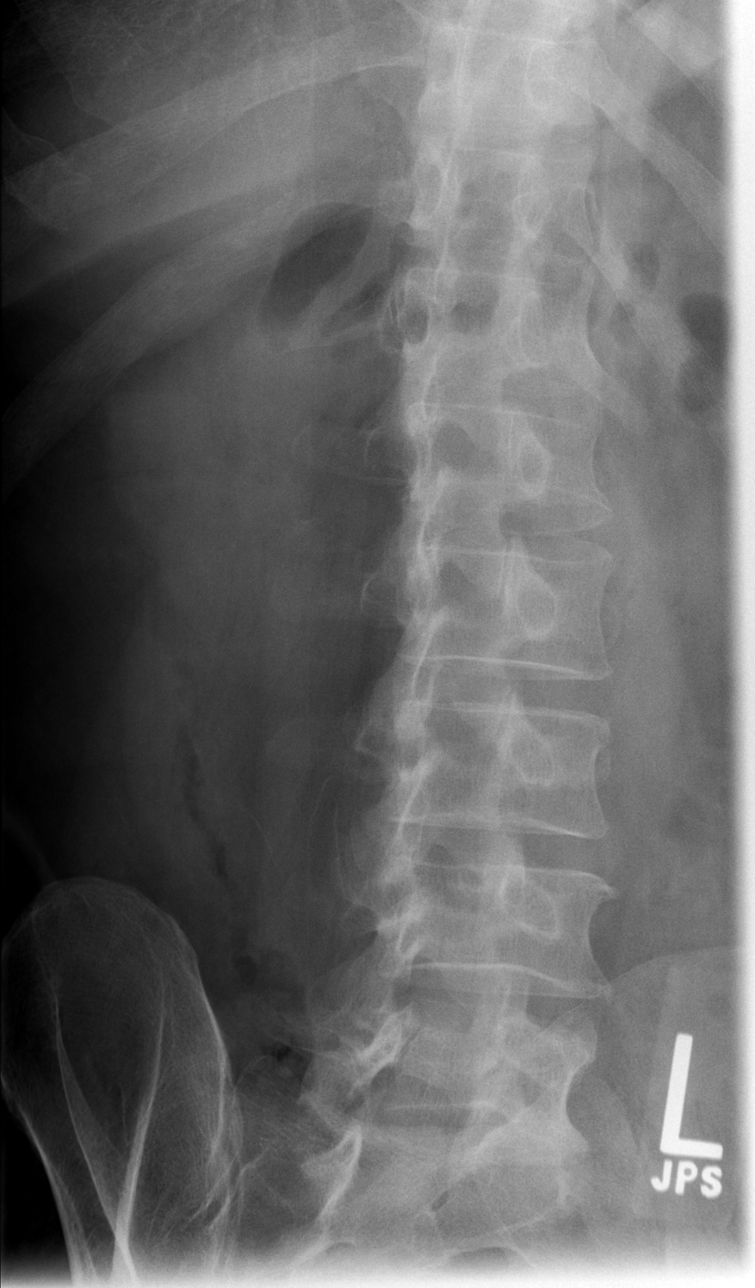

[t l-spine lat]
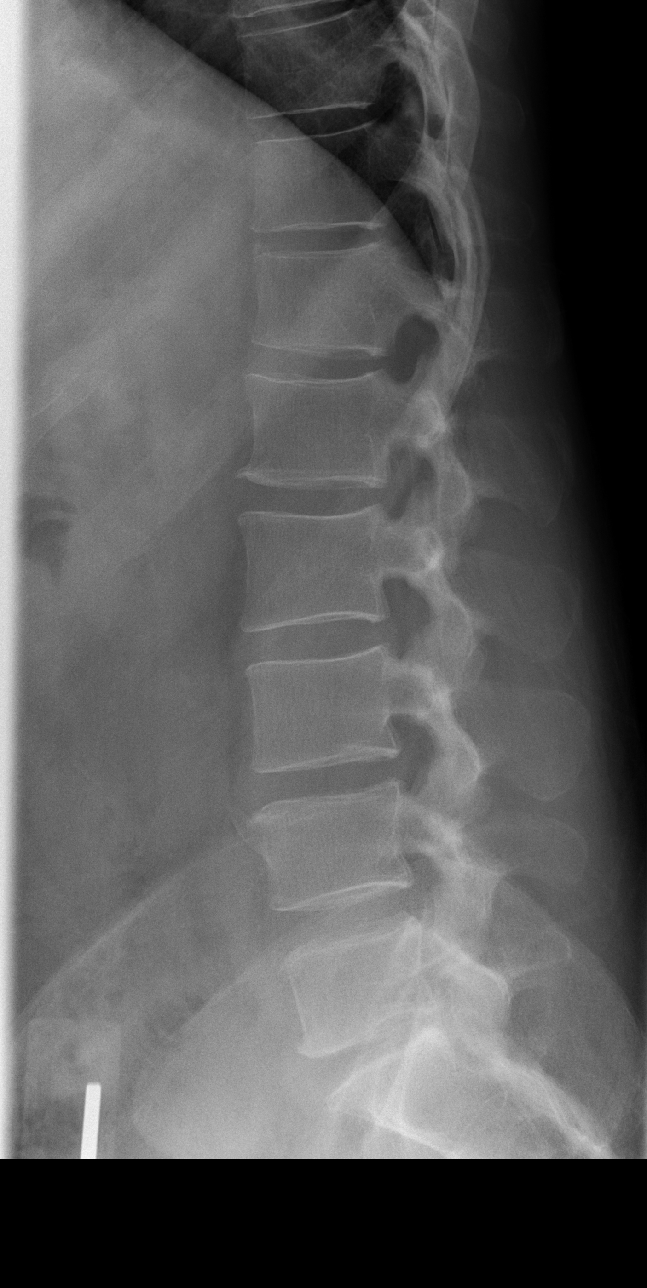

[t l-spine l5-s1 spot]
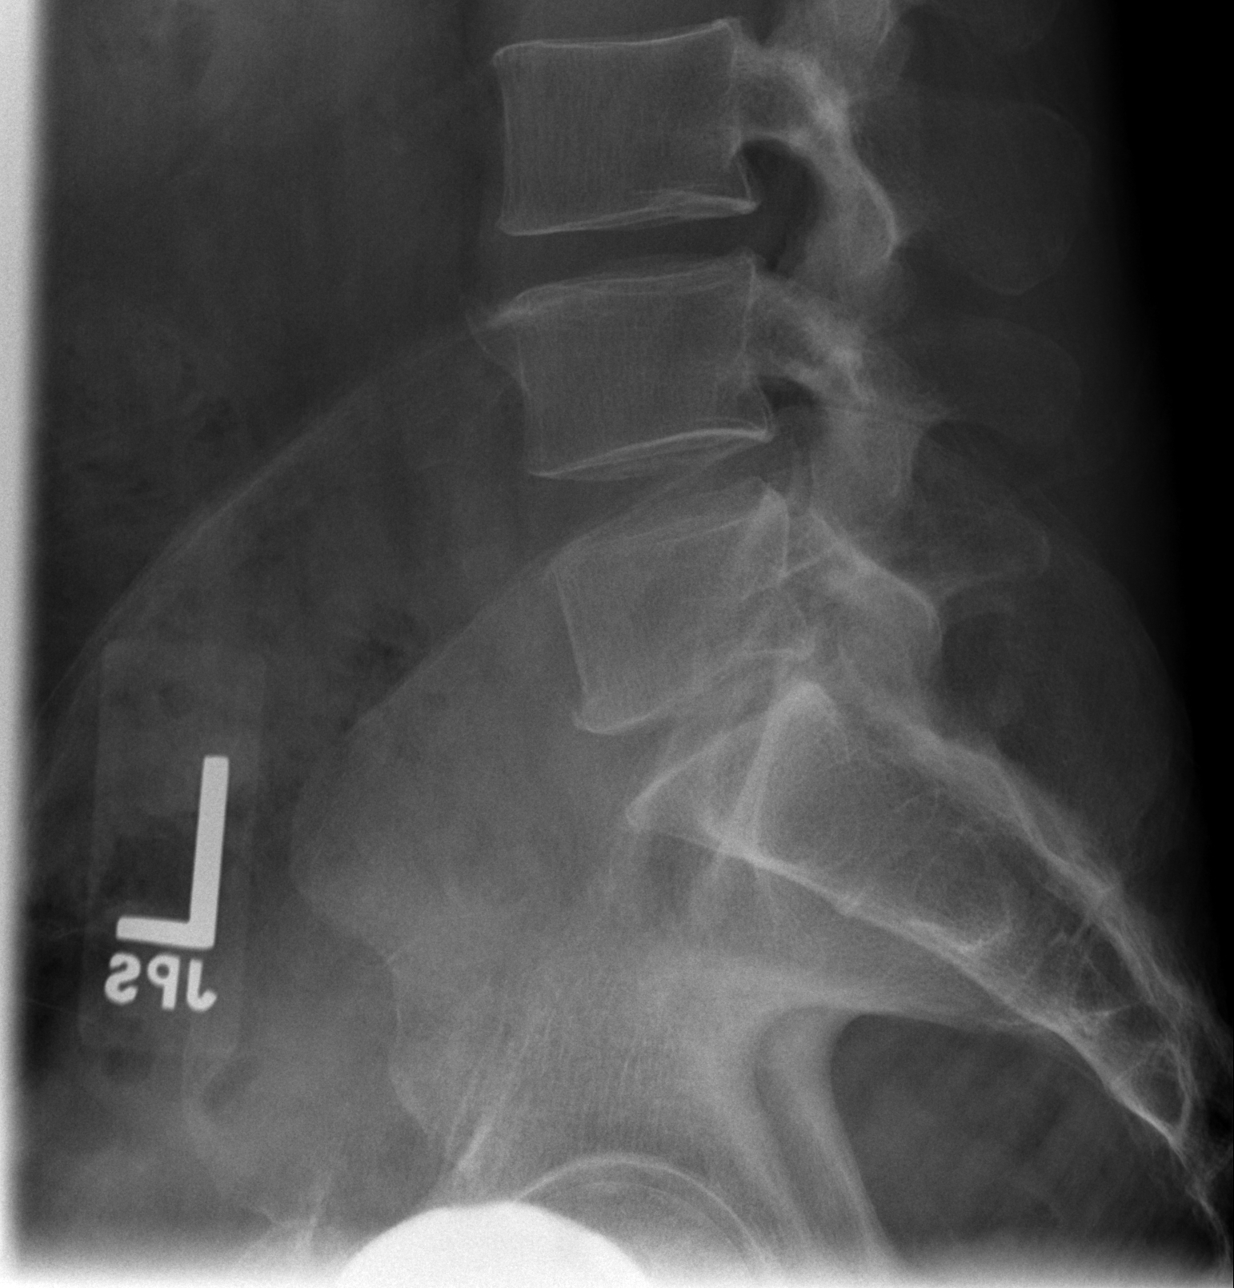

[5 of 5 positions shown; findings below may reference images not displayed]

FINDINGS: Loss of lordosis again noted.  Small twelfth ribs which
is a variation of normal.

No fractures or subluxation.  There is mild disc narrowing at L3-L4
and L5-S1 with some spur formation.  This was noted previously and
have not definitively changed.  Soft tissues unremarkable.
IMPRESSION: Mild degenerative changes as before.  No acute findings.

## 2014-10-09 DIAGNOSIS — Z79899 Other long term (current) drug therapy: Secondary | ICD-10-CM | POA: Diagnosis not present

## 2014-11-04 DIAGNOSIS — F329 Major depressive disorder, single episode, unspecified: Secondary | ICD-10-CM | POA: Diagnosis not present

## 2014-11-04 DIAGNOSIS — I10 Essential (primary) hypertension: Secondary | ICD-10-CM | POA: Diagnosis not present

## 2014-11-04 DIAGNOSIS — F5101 Primary insomnia: Secondary | ICD-10-CM | POA: Diagnosis not present

## 2014-11-04 DIAGNOSIS — R569 Unspecified convulsions: Secondary | ICD-10-CM | POA: Diagnosis not present

## 2014-11-04 DIAGNOSIS — F419 Anxiety disorder, unspecified: Secondary | ICD-10-CM | POA: Diagnosis not present

## 2014-11-27 DIAGNOSIS — Z79899 Other long term (current) drug therapy: Secondary | ICD-10-CM | POA: Diagnosis not present

## 2014-12-03 DIAGNOSIS — F411 Generalized anxiety disorder: Secondary | ICD-10-CM | POA: Diagnosis not present

## 2014-12-03 DIAGNOSIS — F5101 Primary insomnia: Secondary | ICD-10-CM | POA: Diagnosis not present

## 2014-12-03 DIAGNOSIS — R569 Unspecified convulsions: Secondary | ICD-10-CM | POA: Diagnosis not present

## 2014-12-03 DIAGNOSIS — I10 Essential (primary) hypertension: Secondary | ICD-10-CM | POA: Diagnosis not present

## 2014-12-03 DIAGNOSIS — Z6826 Body mass index (BMI) 26.0-26.9, adult: Secondary | ICD-10-CM | POA: Diagnosis not present

## 2014-12-03 DIAGNOSIS — M25552 Pain in left hip: Secondary | ICD-10-CM | POA: Diagnosis not present

## 2014-12-12 DIAGNOSIS — M4726 Other spondylosis with radiculopathy, lumbar region: Secondary | ICD-10-CM | POA: Diagnosis not present

## 2014-12-12 DIAGNOSIS — M25551 Pain in right hip: Secondary | ICD-10-CM | POA: Diagnosis not present

## 2014-12-12 DIAGNOSIS — M25552 Pain in left hip: Secondary | ICD-10-CM | POA: Diagnosis not present

## 2014-12-18 ENCOUNTER — Other Ambulatory Visit: Payer: Self-pay | Admitting: Orthopedic Surgery

## 2014-12-18 DIAGNOSIS — M25552 Pain in left hip: Secondary | ICD-10-CM

## 2014-12-22 ENCOUNTER — Ambulatory Visit
Admission: RE | Admit: 2014-12-22 | Discharge: 2014-12-22 | Disposition: A | Discharge status: Home / Self Care | Payer: Medicaid Other | Source: Ambulatory Visit | Attending: Orthopedic Surgery | Admitting: Orthopedic Surgery

## 2014-12-22 DIAGNOSIS — M87052 Idiopathic aseptic necrosis of left femur: Secondary | ICD-10-CM | POA: Diagnosis not present

## 2014-12-22 DIAGNOSIS — M25552 Pain in left hip: Secondary | ICD-10-CM

## 2014-12-22 DIAGNOSIS — M1612 Unilateral primary osteoarthritis, left hip: Secondary | ICD-10-CM | POA: Diagnosis not present

## 2015-01-13 DIAGNOSIS — M25551 Pain in right hip: Secondary | ICD-10-CM | POA: Diagnosis not present

## 2015-01-13 DIAGNOSIS — M25552 Pain in left hip: Secondary | ICD-10-CM | POA: Diagnosis not present

## 2015-01-13 DIAGNOSIS — M4726 Other spondylosis with radiculopathy, lumbar region: Secondary | ICD-10-CM | POA: Diagnosis not present

## 2015-01-23 ENCOUNTER — Other Ambulatory Visit (HOSPITAL_COMMUNITY): Payer: Self-pay | Admitting: Orthopedic Surgery

## 2015-01-28 ENCOUNTER — Encounter (HOSPITAL_COMMUNITY)
Admission: RE | Admit: 2015-01-28 | Discharge: 2015-01-28 | Disposition: A | Discharge status: Home / Self Care | Payer: Medicare Other | Source: Ambulatory Visit | Attending: Orthopedic Surgery | Admitting: Orthopedic Surgery

## 2015-01-28 ENCOUNTER — Encounter (HOSPITAL_COMMUNITY): Payer: Self-pay

## 2015-01-28 DIAGNOSIS — Z96641 Presence of right artificial hip joint: Secondary | ICD-10-CM | POA: Diagnosis not present

## 2015-01-28 DIAGNOSIS — Z471 Aftercare following joint replacement surgery: Secondary | ICD-10-CM | POA: Diagnosis not present

## 2015-01-28 DIAGNOSIS — M87852 Other osteonecrosis, left femur: Secondary | ICD-10-CM | POA: Diagnosis not present

## 2015-01-28 DIAGNOSIS — M87052 Idiopathic aseptic necrosis of left femur: Principal | ICD-10-CM

## 2015-01-28 DIAGNOSIS — M25552 Pain in left hip: Secondary | ICD-10-CM | POA: Diagnosis not present

## 2015-01-28 DIAGNOSIS — Z96642 Presence of left artificial hip joint: Secondary | ICD-10-CM | POA: Diagnosis not present

## 2015-01-28 DIAGNOSIS — M169 Osteoarthritis of hip, unspecified: Secondary | ICD-10-CM | POA: Diagnosis not present

## 2015-01-28 DIAGNOSIS — I10 Essential (primary) hypertension: Secondary | ICD-10-CM | POA: Diagnosis not present

## 2015-01-28 DIAGNOSIS — Z79899 Other long term (current) drug therapy: Secondary | ICD-10-CM | POA: Diagnosis not present

## 2015-01-28 DIAGNOSIS — Z7982 Long term (current) use of aspirin: Secondary | ICD-10-CM | POA: Diagnosis not present

## 2015-01-28 DIAGNOSIS — M1612 Unilateral primary osteoarthritis, left hip: Secondary | ICD-10-CM | POA: Diagnosis not present

## 2015-01-28 DIAGNOSIS — F329 Major depressive disorder, single episode, unspecified: Secondary | ICD-10-CM | POA: Diagnosis present

## 2015-01-28 DIAGNOSIS — Z01818 Encounter for other preprocedural examination: Secondary | ICD-10-CM | POA: Diagnosis not present

## 2015-01-28 DIAGNOSIS — M87051 Idiopathic aseptic necrosis of right femur: Secondary | ICD-10-CM

## 2015-01-28 HISTORY — DX: Depression, unspecified: F32.A

## 2015-01-28 HISTORY — DX: Essential (primary) hypertension: I10

## 2015-01-28 HISTORY — DX: Major depressive disorder, single episode, unspecified: F32.9

## 2015-01-28 HISTORY — DX: Headache: R51

## 2015-01-28 HISTORY — DX: Unspecified convulsions: R56.9

## 2015-01-28 HISTORY — DX: Headache, unspecified: R51.9

## 2015-01-28 LAB — SURGICAL PCR SCREEN
MRSA, PCR: NEGATIVE
Staphylococcus aureus: POSITIVE — AB

## 2015-01-28 LAB — COMPREHENSIVE METABOLIC PANEL
ALT: 29 U/L (ref 0–53)
AST: 22 U/L (ref 0–37)
Albumin: 3.6 g/dL (ref 3.5–5.2)
Alkaline Phosphatase: 79 U/L (ref 39–117)
Anion gap: 11 (ref 5–15)
BUN: 5 mg/dL — AB (ref 6–23)
CO2: 17 mmol/L — AB (ref 19–32)
CREATININE: 1.02 mg/dL (ref 0.50–1.35)
Calcium: 8.9 mg/dL (ref 8.4–10.5)
Chloride: 103 mmol/L (ref 96–112)
GFR, EST NON AFRICAN AMERICAN: 80 mL/min — AB (ref >90)
Glucose, Bld: 102 mg/dL — ABNORMAL HIGH (ref 70–99)
Potassium: 3.7 mmol/L (ref 3.5–5.1)
SODIUM: 131 mmol/L — AB (ref 135–145)
Total Bilirubin: 0.4 mg/dL (ref 0.3–1.2)
Total Protein: 6.7 g/dL (ref 6.0–8.3)

## 2015-01-28 LAB — CBC
HCT: 42.3 % (ref 39.0–52.0)
HEMOGLOBIN: 15.1 g/dL (ref 13.0–17.0)
MCH: 33.7 pg (ref 26.0–34.0)
MCHC: 35.7 g/dL (ref 30.0–36.0)
MCV: 94.4 fL (ref 78.0–100.0)
PLATELETS: 288 10*3/uL (ref 150–400)
RBC: 4.48 MIL/uL (ref 4.22–5.81)
RDW: 13.1 % (ref 11.5–15.5)
WBC: 9.5 10*3/uL (ref 4.0–10.5)

## 2015-01-28 LAB — APTT: aPTT: 25 seconds (ref 24–37)

## 2015-01-28 LAB — PROTIME-INR
INR: 1.06 (ref 0.00–1.49)
Prothrombin Time: 13.9 seconds (ref 11.6–15.2)

## 2015-01-28 NOTE — Pre-Procedure Instructions (Signed)
Jenean LindauRandall K Barnett  01/28/2015   Your procedure is scheduled on:  Wednesday, February 10th  [facility] at 815 AM.  Call this number if you have problems the morning of surgery: [phone]   Remember:   Do not eat food or drink liquids after midnight.   Take these medicines the morning of surgery with A SIP OF WATER: keppra, celexa, percocet if needed   Do not wear jewelry  Do not wear lotions, powders, or perfumes,deodorant.  Do not shave 48 hours prior to surgery. Men may shave face and neck.  Do not bring valuables to the hospital.  [facility] is not responsible for any belongings or valuables.               Contacts, dentures or bridgework may not be worn into surgery.  Leave suitcase in the car. After surgery it may be brought to your room.  For patients admitted to the hospital, discharge time is determined by your  treatment team.          Please read over the following fact sheets that you were given: Pain Booklet, Coughing and Deep Breathing, MRSA Information and Surgical Site Infection Prevention  [facility] - Preparing for Surgery  Before surgery, you can play an important role.  Because skin is not sterile, your skin needs to be as free of germs as possible.  You can reduce the number of germs on you skin by washing with CHG (chlorahexidine gluconate) soap before surgery.  CHG is an antiseptic cleaner which kills germs and bonds with the skin to continue killing germs even after washing.  Please DO NOT use if you have an allergy to CHG or antibacterial soaps.  If your skin becomes reddened/irritated stop using the CHG and inform your nurse when you arrive at Short Stay.  Do not shave (including legs and underarms) for at least 48 hours prior to the first CHG shower.  You may shave your face.  Please follow these instructions carefully:   1.  Shower with CHG Soap the night before surgery and the morning of Surgery.  2.  If you choose to  wash your hair, wash your hair first as usual with your normal shampoo.  3.  After you shampoo, rinse your hair and body thoroughly to remove the shampoo.  4.  Use CHG as you would any other liquid soap.  You can apply CHG directly to the skin and wash gently with scrungie or a clean washcloth.  5.  Apply the CHG Soap to your body ONLY FROM THE NECK DOWN.  Do not use on open wounds or open sores.  Avoid contact with your eyes, ears, mouth and genitals (private parts).  Wash genitals (private parts) with your normal soap.  6.  Wash thoroughly, paying special attention to the area where your surgery will be performed.  7.  Thoroughly rinse your body with warm water from the neck down.  8.  DO NOT shower/wash with your normal soap after using and rinsing off the CHG Soap.  9.  Pat yourself dry with a clean towel.            10.  Wear clean pajamas.            11.  Place clean sheets on your bed the night of your first shower and do not sleep with pets.  Day of Surgery  Do not apply any lotions/deoderants the morning of surgery.  Please wear clean clothes to the hospital/surgery center.

## 2015-01-28 NOTE — Progress Notes (Signed)
rx called into [facility] [address] per patient request.  Unable to reach patient to inform of positive swab as line was busy could not leave message. Will try again later

## 2015-01-28 NOTE — Progress Notes (Signed)
Primary =- [facility] - dr. Nedra HaiLee No cardiologist ekg in past none recent

## 2015-01-30 NOTE — Progress Notes (Signed)
Voicemail left for patient regarding positive nasal swab and rx was called in

## 2015-02-03 DIAGNOSIS — I10 Essential (primary) hypertension: Secondary | ICD-10-CM | POA: Diagnosis not present

## 2015-02-03 DIAGNOSIS — F411 Generalized anxiety disorder: Secondary | ICD-10-CM | POA: Diagnosis not present

## 2015-02-03 DIAGNOSIS — F5101 Primary insomnia: Secondary | ICD-10-CM | POA: Diagnosis not present

## 2015-02-03 DIAGNOSIS — Z6826 Body mass index (BMI) 26.0-26.9, adult: Secondary | ICD-10-CM | POA: Diagnosis not present

## 2015-02-03 DIAGNOSIS — F329 Major depressive disorder, single episode, unspecified: Secondary | ICD-10-CM | POA: Diagnosis not present

## 2015-02-03 DIAGNOSIS — R569 Unspecified convulsions: Secondary | ICD-10-CM | POA: Diagnosis not present

## 2015-02-04 MED ORDER — CEFAZOLIN SODIUM-DEXTROSE 2-3 GM-% IV SOLR
2.0000 g | INTRAVENOUS | Status: AC
  Administered 2015-02-05: 2 g via INTRAVENOUS
  Filled 2015-02-04: qty 50

## 2015-02-05 ENCOUNTER — Inpatient Hospital Stay (HOSPITAL_COMMUNITY)
Admission: RE | Admit: 2015-02-05 | Discharge: 2015-02-07 | DRG: 470 | Disposition: A | Discharge status: Home Health | Payer: Medicare Other | Source: Ambulatory Visit | Attending: Orthopedic Surgery | Admitting: Orthopedic Surgery

## 2015-02-05 ENCOUNTER — Encounter (HOSPITAL_COMMUNITY): Payer: Self-pay | Admitting: Certified Registered"

## 2015-02-05 ENCOUNTER — Encounter (HOSPITAL_COMMUNITY)
Admission: RE | Disposition: A | Discharge status: Home Health | Payer: Self-pay | Source: Ambulatory Visit | Attending: Orthopedic Surgery

## 2015-02-05 ENCOUNTER — Inpatient Hospital Stay (HOSPITAL_COMMUNITY): Payer: Medicare Other | Admitting: Anesthesiology

## 2015-02-05 ENCOUNTER — Inpatient Hospital Stay (HOSPITAL_COMMUNITY): Payer: Medicare Other

## 2015-02-05 DIAGNOSIS — F329 Major depressive disorder, single episode, unspecified: Secondary | ICD-10-CM | POA: Diagnosis present

## 2015-02-05 DIAGNOSIS — Z96641 Presence of right artificial hip joint: Secondary | ICD-10-CM | POA: Diagnosis not present

## 2015-02-05 DIAGNOSIS — I10 Essential (primary) hypertension: Secondary | ICD-10-CM | POA: Diagnosis not present

## 2015-02-05 DIAGNOSIS — Z96649 Presence of unspecified artificial hip joint: Secondary | ICD-10-CM

## 2015-02-05 DIAGNOSIS — Z79899 Other long term (current) drug therapy: Secondary | ICD-10-CM

## 2015-02-05 DIAGNOSIS — M1612 Unilateral primary osteoarthritis, left hip: Secondary | ICD-10-CM | POA: Diagnosis not present

## 2015-02-05 DIAGNOSIS — Z96642 Presence of left artificial hip joint: Secondary | ICD-10-CM | POA: Diagnosis not present

## 2015-02-05 DIAGNOSIS — M169 Osteoarthritis of hip, unspecified: Secondary | ICD-10-CM | POA: Diagnosis not present

## 2015-02-05 DIAGNOSIS — Z7982 Long term (current) use of aspirin: Secondary | ICD-10-CM | POA: Diagnosis not present

## 2015-02-05 DIAGNOSIS — M87052 Idiopathic aseptic necrosis of left femur: Secondary | ICD-10-CM | POA: Diagnosis not present

## 2015-02-05 DIAGNOSIS — M87059 Idiopathic aseptic necrosis of unspecified femur: Secondary | ICD-10-CM | POA: Diagnosis present

## 2015-02-05 DIAGNOSIS — M87852 Other osteonecrosis, left femur: Principal | ICD-10-CM | POA: Diagnosis present

## 2015-02-05 DIAGNOSIS — M25552 Pain in left hip: Secondary | ICD-10-CM | POA: Diagnosis present

## 2015-02-05 DIAGNOSIS — Z471 Aftercare following joint replacement surgery: Secondary | ICD-10-CM | POA: Diagnosis not present

## 2015-02-05 HISTORY — PX: TOTAL HIP ARTHROPLASTY: SHX124

## 2015-02-05 SURGERY — ARTHROPLASTY, HIP, TOTAL,POSTERIOR APPROACH
Anesthesia: General | Site: Hip | Laterality: Left

## 2015-02-05 MED ORDER — METHOCARBAMOL 1000 MG/10ML IJ SOLN
500.0000 mg | INTRAVENOUS | Status: AC
  Administered 2015-02-05: 500 mg via INTRAVENOUS
  Filled 2015-02-05: qty 5

## 2015-02-05 MED ORDER — ONDANSETRON HCL 4 MG/2ML IJ SOLN: 4.0000 mg | Freq: Four times a day (QID) | INTRAMUSCULAR | Status: DC | PRN

## 2015-02-05 MED ORDER — FENTANYL CITRATE 0.05 MG/ML IJ SOLN
INTRAMUSCULAR | Status: AC
  Filled 2015-02-05: qty 5

## 2015-02-05 MED ORDER — FERROUS SULFATE 325 (65 FE) MG PO TABS
325.0000 mg | ORAL_TABLET | Freq: Three times a day (TID) | ORAL | Status: DC
  Administered 2015-02-06 – 2015-02-07 (×4): 325 mg via ORAL
  Filled 2015-02-05 (×9): qty 1

## 2015-02-05 MED ORDER — ONDANSETRON HCL 4 MG/2ML IJ SOLN
INTRAMUSCULAR | Status: DC | PRN
  Administered 2015-02-05: 4 mg via INTRAVENOUS

## 2015-02-05 MED ORDER — LEVETIRACETAM 750 MG PO TABS
750.0000 mg | ORAL_TABLET | Freq: Two times a day (BID) | ORAL | Status: DC
  Administered 2015-02-05 – 2015-02-07 (×4): 750 mg via ORAL
  Filled 2015-02-05 (×7): qty 1

## 2015-02-05 MED ORDER — KETOROLAC TROMETHAMINE 30 MG/ML IJ SOLN
INTRAMUSCULAR | Status: AC
  Filled 2015-02-05: qty 1

## 2015-02-05 MED ORDER — HYDROMORPHONE HCL 1 MG/ML IJ SOLN
0.5000 mg | INTRAMUSCULAR | Status: AC | PRN
  Administered 2015-02-05 (×4): 0.5 mg via INTRAVENOUS

## 2015-02-05 MED ORDER — SODIUM CHLORIDE 0.9 % IJ SOLN
INTRAMUSCULAR | Status: AC
  Filled 2015-02-05: qty 10

## 2015-02-05 MED ORDER — PROMETHAZINE HCL 25 MG/ML IJ SOLN: 6.2500 mg | INTRAMUSCULAR | Status: DC | PRN

## 2015-02-05 MED ORDER — METOCLOPRAMIDE HCL 10 MG PO TABS: 5.0000 mg | ORAL_TABLET | Freq: Three times a day (TID) | ORAL | Status: DC | PRN

## 2015-02-05 MED ORDER — SODIUM CHLORIDE 0.9 % IV SOLN
INTRAVENOUS | Status: DC
  Administered 2015-02-05: 10 mL/h via INTRAVENOUS

## 2015-02-05 MED ORDER — ALUM & MAG HYDROXIDE-SIMETH 200-200-20 MG/5ML PO SUSP: 30.0000 mL | ORAL | Status: DC | PRN

## 2015-02-05 MED ORDER — SODIUM CHLORIDE 0.9 % IR SOLN
Status: DC | PRN
  Administered 2015-02-05: 1000 mL

## 2015-02-05 MED ORDER — ROCURONIUM BROMIDE 100 MG/10ML IV SOLN
INTRAVENOUS | Status: DC | PRN
  Administered 2015-02-05: 40 mg via INTRAVENOUS
  Administered 2015-02-05: 10 mg via INTRAVENOUS

## 2015-02-05 MED ORDER — GLYCOPYRROLATE 0.2 MG/ML IJ SOLN
INTRAMUSCULAR | Status: AC
  Filled 2015-02-05: qty 3

## 2015-02-05 MED ORDER — ASPIRIN EC 325 MG PO TBEC
325.0000 mg | DELAYED_RELEASE_TABLET | Freq: Every day | ORAL | Status: DC
Start: 2015-02-06 — End: 2015-02-07
  Administered 2015-02-06 – 2015-02-07 (×2): 325 mg via ORAL
  Filled 2015-02-05 (×3): qty 1

## 2015-02-05 MED ORDER — NEOSTIGMINE METHYLSULFATE 10 MG/10ML IV SOLN
INTRAVENOUS | Status: DC | PRN
  Administered 2015-02-05: 4 mg via INTRAVENOUS

## 2015-02-05 MED ORDER — ARTIFICIAL TEARS OP OINT
TOPICAL_OINTMENT | OPHTHALMIC | Status: DC | PRN
Start: 2015-02-05 — End: 2015-02-05
  Administered 2015-02-05: 1 via OPHTHALMIC

## 2015-02-05 MED ORDER — FENTANYL CITRATE 0.05 MG/ML IJ SOLN
INTRAMUSCULAR | Status: AC
  Administered 2015-02-05: 50 ug via INTRAVENOUS
  Filled 2015-02-05: qty 2

## 2015-02-05 MED ORDER — PROPOFOL 10 MG/ML IV BOLUS
INTRAVENOUS | Status: DC | PRN
  Administered 2015-02-05: 200 mg via INTRAVENOUS

## 2015-02-05 MED ORDER — ACETAMINOPHEN 325 MG PO TABS
650.0000 mg | ORAL_TABLET | Freq: Four times a day (QID) | ORAL | Status: DC | PRN
  Administered 2015-02-05 – 2015-02-07 (×4): 650 mg via ORAL
  Filled 2015-02-05 (×5): qty 2

## 2015-02-05 MED ORDER — METHOCARBAMOL 500 MG PO TABS
500.0000 mg | ORAL_TABLET | Freq: Four times a day (QID) | ORAL | Status: DC | PRN
Start: 2015-02-05 — End: 2015-02-07
  Administered 2015-02-05 – 2015-02-07 (×6): 500 mg via ORAL
  Filled 2015-02-05 (×6): qty 1

## 2015-02-05 MED ORDER — FENTANYL CITRATE 0.05 MG/ML IJ SOLN
50.0000 ug | Freq: Once | INTRAMUSCULAR | Status: AC
  Administered 2015-02-05: 50 ug via INTRAVENOUS

## 2015-02-05 MED ORDER — KETOROLAC TROMETHAMINE 30 MG/ML IJ SOLN
30.0000 mg | Freq: Once | INTRAMUSCULAR | Status: AC | PRN
  Administered 2015-02-05: 30 mg via INTRAVENOUS

## 2015-02-05 MED ORDER — GLYCOPYRROLATE 0.2 MG/ML IJ SOLN
INTRAMUSCULAR | Status: DC | PRN
  Administered 2015-02-05: 0.6 mg via INTRAVENOUS

## 2015-02-05 MED ORDER — PROPOFOL 10 MG/ML IV BOLUS
INTRAVENOUS | Status: AC
  Filled 2015-02-05: qty 20

## 2015-02-05 MED ORDER — HYDROMORPHONE HCL 1 MG/ML IJ SOLN
1.0000 mg | INTRAMUSCULAR | Status: DC | PRN
  Administered 2015-02-06 (×2): 1 mg via INTRAVENOUS
  Filled 2015-02-05 (×3): qty 1

## 2015-02-05 MED ORDER — CITALOPRAM HYDROBROMIDE 20 MG PO TABS
20.0000 mg | ORAL_TABLET | Freq: Every day | ORAL | Status: DC
Start: 2015-02-05 — End: 2015-02-07
  Administered 2015-02-06 – 2015-02-07 (×2): 20 mg via ORAL
  Filled 2015-02-05 (×3): qty 1

## 2015-02-05 MED ORDER — ARTIFICIAL TEARS OP OINT
TOPICAL_OINTMENT | OPHTHALMIC | Status: AC
  Filled 2015-02-05: qty 3.5

## 2015-02-05 MED ORDER — LIDOCAINE HCL (CARDIAC) 20 MG/ML IV SOLN
INTRAVENOUS | Status: AC
  Filled 2015-02-05: qty 5

## 2015-02-05 MED ORDER — METOCLOPRAMIDE HCL 5 MG/ML IJ SOLN: 5.0000 mg | Freq: Three times a day (TID) | INTRAMUSCULAR | Status: DC | PRN

## 2015-02-05 MED ORDER — KETOROLAC TROMETHAMINE 15 MG/ML IJ SOLN
15.0000 mg | Freq: Four times a day (QID) | INTRAMUSCULAR | Status: DC
  Administered 2015-02-05 – 2015-02-06 (×3): 15 mg via INTRAVENOUS
  Filled 2015-02-05 (×4): qty 1

## 2015-02-05 MED ORDER — CEFAZOLIN SODIUM 1-5 GM-% IV SOLN
1.0000 g | Freq: Four times a day (QID) | INTRAVENOUS | Status: AC
  Administered 2015-02-05: 1 g via INTRAVENOUS
  Filled 2015-02-05 (×2): qty 50

## 2015-02-05 MED ORDER — MIDAZOLAM HCL 2 MG/2ML IJ SOLN
INTRAMUSCULAR | Status: AC
  Filled 2015-02-05: qty 2

## 2015-02-05 MED ORDER — HYDROMORPHONE HCL 1 MG/ML IJ SOLN
INTRAMUSCULAR | Status: AC
  Administered 2015-02-05: 0.5 mg via INTRAVENOUS
  Filled 2015-02-05: qty 1

## 2015-02-05 MED ORDER — OXYCODONE HCL 5 MG/5ML PO SOLN: 5.0000 mg | Freq: Once | ORAL | Status: DC | PRN

## 2015-02-05 MED ORDER — HYDROMORPHONE HCL 1 MG/ML IJ SOLN
0.2500 mg | INTRAMUSCULAR | Status: DC | PRN
  Administered 2015-02-05 (×4): 0.5 mg via INTRAVENOUS

## 2015-02-05 MED ORDER — OXYCODONE HCL 5 MG PO TABS
ORAL_TABLET | ORAL | Status: AC
  Filled 2015-02-05: qty 2

## 2015-02-05 MED ORDER — MENTHOL 3 MG MT LOZG: 1.0000 | LOZENGE | OROMUCOSAL | Status: DC | PRN

## 2015-02-05 MED ORDER — LIDOCAINE HCL (CARDIAC) 20 MG/ML IV SOLN
INTRAVENOUS | Status: DC | PRN
  Administered 2015-02-05: 100 mg via INTRAVENOUS

## 2015-02-05 MED ORDER — DIPHENHYDRAMINE HCL 12.5 MG/5ML PO ELIX
12.5000 mg | ORAL_SOLUTION | ORAL | Status: DC | PRN
  Administered 2015-02-06 – 2015-02-07 (×5): 25 mg via ORAL
  Filled 2015-02-05 (×7): qty 10

## 2015-02-05 MED ORDER — DEXTROSE 5 % IV SOLN
500.0000 mg | Freq: Four times a day (QID) | INTRAVENOUS | Status: DC | PRN
  Filled 2015-02-05: qty 5

## 2015-02-05 MED ORDER — PHENOL 1.4 % MT LIQD: 1.0000 | OROMUCOSAL | Status: DC | PRN

## 2015-02-05 MED ORDER — ACETAMINOPHEN 650 MG RE SUPP: 650.0000 mg | Freq: Four times a day (QID) | RECTAL | Status: DC | PRN

## 2015-02-05 MED ORDER — ONDANSETRON HCL 4 MG/2ML IJ SOLN
INTRAMUSCULAR | Status: AC
  Filled 2015-02-05: qty 2

## 2015-02-05 MED ORDER — HYDROMORPHONE HCL 1 MG/ML IJ SOLN
INTRAMUSCULAR | Status: AC
Start: 2015-02-05 — End: 2015-02-05
  Administered 2015-02-05: 0.5 mg via INTRAVENOUS
  Filled 2015-02-05: qty 1

## 2015-02-05 MED ORDER — ONDANSETRON HCL 4 MG PO TABS
4.0000 mg | ORAL_TABLET | Freq: Four times a day (QID) | ORAL | Status: DC | PRN
  Administered 2015-02-05: 4 mg via ORAL
  Filled 2015-02-05: qty 1

## 2015-02-05 MED ORDER — LISINOPRIL 40 MG PO TABS
40.0000 mg | ORAL_TABLET | Freq: Every day | ORAL | Status: DC
  Administered 2015-02-06: 40 mg via ORAL
  Filled 2015-02-05 (×2): qty 1

## 2015-02-05 MED ORDER — ROCURONIUM BROMIDE 50 MG/5ML IV SOLN
INTRAVENOUS | Status: AC
  Filled 2015-02-05: qty 1

## 2015-02-05 MED ORDER — HYDROMORPHONE HCL 1 MG/ML IJ SOLN
INTRAMUSCULAR | Status: AC
  Filled 2015-02-05: qty 1

## 2015-02-05 MED ORDER — MIDAZOLAM HCL 5 MG/5ML IJ SOLN
INTRAMUSCULAR | Status: DC | PRN
  Administered 2015-02-05: 2 mg via INTRAVENOUS

## 2015-02-05 MED ORDER — NEOSTIGMINE METHYLSULFATE 10 MG/10ML IV SOLN
INTRAVENOUS | Status: AC
  Filled 2015-02-05: qty 1

## 2015-02-05 MED ORDER — OXYCODONE HCL 5 MG PO TABS: 5.0000 mg | ORAL_TABLET | Freq: Once | ORAL | Status: DC | PRN

## 2015-02-05 MED ORDER — FENTANYL CITRATE 0.05 MG/ML IJ SOLN
INTRAMUSCULAR | Status: DC | PRN
  Administered 2015-02-05: 150 ug via INTRAVENOUS
  Administered 2015-02-05 (×2): 50 ug via INTRAVENOUS

## 2015-02-05 MED ORDER — OXYCODONE HCL 5 MG PO TABS
5.0000 mg | ORAL_TABLET | ORAL | Status: DC | PRN
  Administered 2015-02-05 – 2015-02-06 (×7): 10 mg via ORAL
  Filled 2015-02-05 (×6): qty 2

## 2015-02-05 MED ORDER — EPHEDRINE SULFATE 50 MG/ML IJ SOLN
INTRAMUSCULAR | Status: AC
  Filled 2015-02-05: qty 1

## 2015-02-05 MED ORDER — LACTATED RINGERS IV SOLN
INTRAVENOUS | Status: DC
  Administered 2015-02-05 (×2): via INTRAVENOUS

## 2015-02-05 SURGICAL SUPPLY — 48 items
BLADE SAW SAG 73X25 THK (BLADE) ×2
BLADE SAW SGTL 73X25 THK (BLADE) ×1 IMPLANT
BLADE SURG 10 STRL SS (BLADE) IMPLANT
BLADE SURG 21 STRL SS (BLADE) ×3 IMPLANT
BRUSH FEMORAL CANAL (MISCELLANEOUS) IMPLANT
CAPT HIP TOTAL 2 ×3 IMPLANT
COVER BACK TABLE 24X17X13 BIG (DRAPES) IMPLANT
COVER SURGICAL LIGHT HANDLE (MISCELLANEOUS) ×3 IMPLANT
DRAPE IMP U-DRAPE 54X76 (DRAPES) ×3 IMPLANT
DRAPE INCISE IOBAN 85X60 (DRAPES) ×3 IMPLANT
DRAPE ORTHO SPLIT 77X108 STRL (DRAPES) ×4
DRAPE SURG ORHT 6 SPLT 77X108 (DRAPES) ×2 IMPLANT
DRAPE U-SHAPE 47X51 STRL (DRAPES) ×3 IMPLANT
DRSG MEPILEX BORDER 4X12 (GAUZE/BANDAGES/DRESSINGS) ×3 IMPLANT
DRSG MEPILEX BORDER 4X8 (GAUZE/BANDAGES/DRESSINGS) IMPLANT
DURAPREP 26ML APPLICATOR (WOUND CARE) ×3 IMPLANT
ELECT BLADE 6.5 EXT (BLADE) IMPLANT
ELECT CAUTERY BLADE 6.4 (BLADE) ×3 IMPLANT
ELECT REM PT RETURN 9FT ADLT (ELECTROSURGICAL) ×3
ELECTRODE REM PT RTRN 9FT ADLT (ELECTROSURGICAL) ×1 IMPLANT
GLOVE BIOGEL PI IND STRL 9 (GLOVE) ×1 IMPLANT
GLOVE BIOGEL PI INDICATOR 9 (GLOVE) ×2
GLOVE SURG ORTHO 9.0 STRL STRW (GLOVE) ×3 IMPLANT
GOWN STRL REUS W/ TWL XL LVL3 (GOWN DISPOSABLE) ×2 IMPLANT
GOWN STRL REUS W/TWL XL LVL3 (GOWN DISPOSABLE) ×4
HANDPIECE INTERPULSE COAX TIP (DISPOSABLE)
HIP CAPITATED TOTAL 2 ×1 IMPLANT
KIT BASIN OR (CUSTOM PROCEDURE TRAY) ×3 IMPLANT
KIT ROOM TURNOVER OR (KITS) ×3 IMPLANT
MANIFOLD NEPTUNE II (INSTRUMENTS) ×3 IMPLANT
NS IRRIG 1000ML POUR BTL (IV SOLUTION) ×3 IMPLANT
PACK TOTAL JOINT (CUSTOM PROCEDURE TRAY) ×3 IMPLANT
PACK UNIVERSAL I (CUSTOM PROCEDURE TRAY) ×3 IMPLANT
PAD ARMBOARD 7.5X6 YLW CONV (MISCELLANEOUS) ×3 IMPLANT
PRESSURIZER FEMORAL UNIV (MISCELLANEOUS) IMPLANT
SET HNDPC FAN SPRY TIP SCT (DISPOSABLE) IMPLANT
STAPLER VISISTAT 35W (STAPLE) ×3 IMPLANT
SUT ETHIBOND NAB CT1 #1 30IN (SUTURE) ×6 IMPLANT
SUT VIC AB 0 CT1 27 (SUTURE) ×2
SUT VIC AB 0 CT1 27XBRD ANBCTR (SUTURE) ×1 IMPLANT
SUT VIC AB 1 CTX 36 (SUTURE) ×2
SUT VIC AB 1 CTX36XBRD ANBCTR (SUTURE) ×1 IMPLANT
SUT VIC AB 2-0 CTB1 (SUTURE) ×3 IMPLANT
TOWEL OR 17X24 6PK STRL BLUE (TOWEL DISPOSABLE) ×3 IMPLANT
TOWEL OR 17X26 10 PK STRL BLUE (TOWEL DISPOSABLE) ×3 IMPLANT
TOWER CARTRIDGE SMART MIX (DISPOSABLE) IMPLANT
TRAY FOLEY CATH 16FRSI W/METER (SET/KITS/TRAYS/PACK) IMPLANT
WATER STERILE IRR 1000ML POUR (IV SOLUTION) IMPLANT

## 2015-02-05 NOTE — Progress Notes (Signed)
Utilization review completed.  

## 2015-02-05 NOTE — Anesthesia Preprocedure Evaluation (Addendum)
Anesthesia Evaluation  Patient identified by MRN, date of birth, ID band Patient awake    Reviewed: Allergy & Precautions, NPO status , Patient's Chart, lab work & pertinent test results  Airway Mallampati: III  TM Distance: >3 FB Neck ROM: Full    Dental  (+) Poor Dentition, Dental Advisory Given   Pulmonary neg pulmonary ROS,  breath sounds clear to auscultation        Cardiovascular hypertension, Pt. on medications Rhythm:Regular Rate:Normal     Neuro/Psych  Headaches, Seizures -,  Depression    GI/Hepatic negative GI ROS, Neg liver ROS,   Endo/Other  negative endocrine ROS  Renal/GU negative Renal ROS     Musculoskeletal   Abdominal   Peds  Hematology negative hematology ROS (+)   Anesthesia Other Findings   Reproductive/Obstetrics                            Anesthesia Physical Anesthesia Plan  ASA: II  Anesthesia Plan: General   Post-op Pain Management:    Induction: Intravenous  Airway Management Planned: Oral ETT  Additional Equipment:   Intra-op Plan:   Post-operative Plan: Extubation in OR  Informed Consent: I have reviewed the patients History and Physical, chart, labs and discussed the procedure including the risks, benefits and alternatives for the proposed anesthesia with the patient or authorized representative who has indicated his/her understanding and acceptance.   Dental advisory given  Plan Discussed with: CRNA  Anesthesia Plan Comments:        Anesthesia Quick Evaluation

## 2015-02-05 NOTE — Op Note (Signed)
02/05/2015  11:06 AM  PATIENT:  [name]    PRE-OPERATIVE DIAGNOSIS:  Avascular Necrosis Left Hip  POST-OPERATIVE DIAGNOSIS:  Same  PROCEDURE:  TOTAL HIP ARTHROPLASTY  SURGEON:  Nadara MustardUDA,MARCUS V, MD  PHYSICIAN ASSISTANT:None ANESTHESIA:   General  PREOPERATIVE INDICATIONS:  [name] is a  59 y.o. male with a diagnosis of Avascular Necrosis Left Hip who failed conservative measures and elected for surgical management.    The risks benefits and alternatives were discussed with the patient preoperatively including but not limited to the risks of infection, bleeding, nerve injury, cardiopulmonary complications, the need for revision surgery, among others, and the patient was willing to proceed.  OPERATIVE IMPLANTS: Zimmer total hip arthroplasty. Size 11 femur. Size 52 mm acetabulum. 32 mm head. 0 polyethylene liner. +4 mm extension and offset with anteversion.  OPERATIVE FINDINGS: Hip stable with flexion to 100 full adduction and internal rotation of 80.  OPERATIVE PROCEDURE: Patient was brought to the operating room and underwent a general anesthetic. After adequate levels of anesthesia were obtained patient was placed in the right lateral decubitus position with the left side up and the left lower extremity was prepped using DuraPrep draped into a sterile field. [place] was used to cover all exposed skin. A timeout was called. A posterior lateral incision was made this was carried down through the tensor fascia lata which was split. The P aforementioned short external rotators and capsule was incised off the femoral neck and retracted laterally. The femoral neck cut was made 1 cm proximal to the calcar. Attention was first focused on the acetabulum. Acetabulum was sequentially reamed to 52 mm for a 52 mm cup this was inserted with 45 of abduction and 20 of anteversion. 0 polyethylene liner was inserted. Attention was then focused on the femur. The femur was sequentially  broached up to a size 11. The size 11 femoral implant was placed. Patient was trialed with several next and the +4 extension and leg length with anteversion was used and most stable. The wound was irrigated with normal saline throughout the case. The final implant was placed and the hip was placed with full range of motion and was stable. The capsule short external rotators and piriformis were reapproximated with #1 Ethibond. The tensor fascia lata was closed using #1 Vicryl. Subcutaneous was closed using 0 Vicryl. Skin was closed using staples. Mepilex dressing was applied. Patient was extubated taken to the PACU in stable condition.

## 2015-02-05 NOTE — Transfer of Care (Signed)
Immediate Anesthesia Transfer of Care Note  Patient: [name]  Procedure(s) Performed: Procedure(s): TOTAL HIP ARTHROPLASTY (Left)  Patient Location: PACU  Anesthesia Type:General  Level of Consciousness: awake, alert  and oriented  Airway & Oxygen Therapy: Patient Spontanous Breathing and Patient connected to nasal cannula oxygen  Post-op Assessment: Report given to RN  Post vital signs: Reviewed and stable  Last Vitals:  Filed Vitals:   02/05/15 0800  BP: 130/91  Pulse: 117  Temp: 36.7 C    Complications: No apparent anesthesia complications

## 2015-02-05 NOTE — Anesthesia Procedure Notes (Signed)
Procedure Name: Intubation Date/Time: 02/05/2015 10:08 AM Performed by: Jefm MilesENNIE, Nobie Alleyne E Pre-anesthesia Checklist: Patient identified, Emergency Drugs available, Suction available, Patient being monitored and Timeout performed Patient Re-evaluated:Patient Re-evaluated prior to inductionOxygen Delivery Method: Circle system utilized Preoxygenation: Pre-oxygenation with 100% oxygen Intubation Type: IV induction Ventilation: Mask ventilation without difficulty Laryngoscope Size: Mac and 3 Grade View: Grade I Tube type: Oral Tube size: 7.5 mm Number of attempts: 1 Airway Equipment and Method: Stylet Placement Confirmation: ETT inserted through vocal cords under direct vision,  positive ETCO2 and breath sounds checked- equal and bilateral Secured at: 21 cm Tube secured with: Tape Dental Injury: Teeth and Oropharynx as per pre-operative assessment

## 2015-02-05 NOTE — Anesthesia Postprocedure Evaluation (Signed)
Anesthesia Post Note  Patient: [name]  Procedure(s) Performed: Procedure(s) (LRB): TOTAL HIP ARTHROPLASTY (Left)  Anesthesia type: General  Patient location: PACU  Post pain: Pain level controlled  Post assessment: Post-op Vital signs reviewed  Last Vitals: BP 106/73 mmHg  Pulse 90  Temp(Src) 36.7 C (Oral)  Resp 12  Ht 5\' 8"  (1.727 m)  Wt 158 lb (71.668 kg)  BMI 24.03 kg/m2  SpO2 100%  Post vital signs: Reviewed  Level of consciousness: sedated  Complications: No apparent anesthesia complications

## 2015-02-05 NOTE — H&P (Signed)
TOTAL HIP ADMISSION H&P  Patient is admitted for left total hip arthroplasty.  Subjective:  Chief Complaint: left hip pain  HPI: [name], 59 y.o. male, has a history of pain and functional disability in the left hip(s) due to Primary osteoarthritis and patient has failed non-surgical conservative treatments for greater than 12 weeks to include NSAID's and/or analgesics, use of assistive devices, weight reduction as appropriate and activity modification.  Onset of symptoms was gradual starting 8 years ago with gradually worsening course since that time.The patient noted no past surgery on the left hip(s).  Patient currently rates pain in the left hip at 8 out of 10 with activity. Patient has night pain, worsening of pain with activity and weight bearing, trendelenberg gait, pain that interfers with activities of daily living and pain with passive range of motion. Patient has evidence of subchondral cysts, subchondral sclerosis, periarticular osteophytes and joint space narrowing by imaging studies. This condition presents safety issues increasing the risk of falls. This patient has had avascular necrosis of the hip, acetabular fracture, hip dysplasia.  There is no current active infection.  There are no active problems to display for this patient.  Past Medical History  Diagnosis Date  . Hypertension   . Depression   . Kidney stone   . Headache   . Seizures     last seizure about year ago (was driving at time of seizure)    Past Surgical History  Procedure Laterality Date  . Rotator cuff repair Left   . Joint replacement Right     hip    No prescriptions prior to admission   No Known Allergies  History  Substance Use Topics  . Smoking status: Never Smoker   . Smokeless tobacco: Not on file  . Alcohol Use: 2.4 oz/week    4 Cans of beer per week    No family history on file.   Review of Systems  All other systems reviewed and are negative.   Objective:  Physical  Exam  Vital signs in last 24 hours:    Labs:   There is no height or weight on file to calculate BMI.   Imaging Review Plain radiographs demonstrate moderate degenerative joint disease of the left hip(s). The bone quality appears to be adequate for age and reported activity level.  Assessment/Plan:  End stage arthritis, left hip(s)  The patient history, physical examination, clinical judgement of the provider and imaging studies are consistent with end stage degenerative joint disease of the left hip(s) and total hip arthroplasty is deemed medically necessary. The treatment options including medical management, injection therapy, arthroscopy and arthroplasty were discussed at length. The risks and benefits of total hip arthroplasty were presented and reviewed. The risks due to aseptic loosening, infection, stiffness, dislocation/subluxation,  thromboembolic complications and other imponderables were discussed.  The patient acknowledged the explanation, agreed to proceed with the plan and consent was signed. Patient is being admitted for inpatient treatment for surgery, pain control, PT, OT, prophylactic antibiotics, VTE prophylaxis, progressive ambulation and ADL's and discharge planning.The patient is planning to be discharged home with home health services

## 2015-02-06 LAB — CBC
HEMATOCRIT: 35 % — AB (ref 39.0–52.0)
Hemoglobin: 11.8 g/dL — ABNORMAL LOW (ref 13.0–17.0)
MCH: 34 pg (ref 26.0–34.0)
MCHC: 33.7 g/dL (ref 30.0–36.0)
MCV: 100.9 fL — AB (ref 78.0–100.0)
Platelets: 199 10*3/uL (ref 150–400)
RBC: 3.47 MIL/uL — ABNORMAL LOW (ref 4.22–5.81)
RDW: 14.5 % (ref 11.5–15.5)
WBC: 5.9 10*3/uL (ref 4.0–10.5)

## 2015-02-06 LAB — BASIC METABOLIC PANEL
ANION GAP: 9 (ref 5–15)
BUN: 7 mg/dL (ref 6–23)
CALCIUM: 8.1 mg/dL — AB (ref 8.4–10.5)
CO2: 24 mmol/L (ref 19–32)
CREATININE: 0.9 mg/dL (ref 0.50–1.35)
Chloride: 99 mmol/L (ref 96–112)
GFR calc non Af Amer: >90 mL/min (ref >90)
Glucose, Bld: 109 mg/dL — ABNORMAL HIGH (ref 70–99)
Potassium: 4.3 mmol/L (ref 3.5–5.1)
SODIUM: 132 mmol/L — AB (ref 135–145)

## 2015-02-06 MED ORDER — KETOROLAC TROMETHAMINE 30 MG/ML IJ SOLN
30.0000 mg | Freq: Four times a day (QID) | INTRAMUSCULAR | Status: DC
  Administered 2015-02-06 – 2015-02-07 (×5): 30 mg via INTRAVENOUS
  Filled 2015-02-06 (×8): qty 1

## 2015-02-06 MED ORDER — OXYCODONE HCL 5 MG PO TABS
20.0000 mg | ORAL_TABLET | ORAL | Status: DC | PRN
  Administered 2015-02-06 – 2015-02-07 (×6): 20 mg via ORAL
  Filled 2015-02-06 (×7): qty 4

## 2015-02-06 NOTE — Progress Notes (Signed)
02[facility]06/15 [facility]. Spoke with patient, no change in d/c plan. He has a rolling walker but not a 3N1. Patient stated that his daughter who lives near by will be able to assist but no one will be staying with him at night.Informed patient that he should have someone with him at night and able to assist 24/7. He stated that he would get someone to stay with him at night. Contacted [name] with [facility] and requested a 3N1 be delivered to patient's room. Will continue to follow until discharge.

## 2015-02-06 NOTE — Progress Notes (Signed)
Physical Therapy Treatment Patient Details Name: [name] MRN: [id number] DOB: [birth year] Today's Date: 02/06/2015    History of Present Illness Pt is a 59 y.o. s/p L posterior THA for avascular necrosis on 02/05/15. PMH: HTN, depression, seizures, Left rotator cuff repair, R THA.     PT Comments    Pt able to progress mobility and exercises in spite of c/o incr pain. Pt very shaky and anxious when participating in therapy. No noted LOB with mobility. Min cues throughout for hip precaution management. Pt hopeful to D/C home tomorrow if pain is manageable.   Follow Up Recommendations  Home health PT;Supervision/Assistance - 24 hour     Equipment Recommendations  3in1 (PT)    Recommendations for Other Services       Precautions / Restrictions Precautions Precautions: Posterior Hip Precaution Comments: pt able to recall 2/3 independently; reviewed with pt Restrictions Weight Bearing Restrictions: Yes LLE Weight Bearing: Weight bearing as tolerated    Mobility  Bed Mobility Overal bed mobility: Needs Assistance Bed Mobility: Supine to Sit     Supine to sit: Min guard;HOB elevated     General bed mobility comments: educated on using sheet as leg lift to (A) Lt LE off EOB; cues for technique and hip precautions   Transfers Overall transfer level: Needs assistance Equipment used: Rolling walker (2 wheeled) Transfers: Sit to/from Stand Sit to Stand: Supervision         General transfer comment: min cues for technique   Ambulation/Gait Ambulation/Gait assistance: Supervision Ambulation Distance (Feet): 100 Feet Assistive device: Rolling walker (2 wheeled) Gait Pattern/deviations: Step-through pattern;Decreased stance time - left;Decreased step length - right;Antalgic Gait velocity: decr Gait velocity interpretation: Below normal speed for age/gender General Gait Details: pt with shaky gt, appears to be very anxious; cues for step through gt sequencing; no  noted LOB with mobility   Stairs            Wheelchair Mobility    Modified Rankin (Stroke Patients Only)       Balance Overall balance assessment: Needs assistance Sitting-balance support: Feet supported;No upper extremity supported Sitting balance-Leahy Scale: Good     Standing balance support: During functional activity;Bilateral upper extremity supported Standing balance-Leahy Scale: Poor Standing balance comment: RW to balance                     Cognition Arousal/Alertness: Awake/alert Behavior During Therapy: WFL for tasks assessed/performed Overall Cognitive Status: Within Functional Limits for tasks assessed       Memory: Decreased recall of precautions              Exercises Total Joint Exercises Ankle Circles/Pumps: AROM;Both;15 reps Gluteal Sets: 15 reps Heel Slides: AAROM;Left;10 reps;Seated Hip ABduction/ADduction: AAROM;Left;10 reps;Seated Long Arc Quad: AROM;Left;10 reps;Seated    General Comments        Pertinent Vitals/Pain Pain Assessment: 0-10 Pain Score: 8  Pain Location: Lt hip Pain Descriptors / Indicators: Constant;Throbbing Pain Intervention(s): Monitored during session;Premedicated before session;Repositioned;Ice applied    Home Living                      Prior Function            PT Goals (current goals can now be found in the care plan section) Acute Rehab PT Goals Patient Stated Goal: to not have so much pain  PT Goal Formulation: With patient Time For Goal Achievement: 02/13/15 Potential to Achieve Goals: Good Progress towards PT goals:  Progressing toward goals    Frequency  7X/week    PT Plan Current plan remains appropriate    Co-evaluation             End of Session Equipment Utilized During Treatment: Gait belt Activity Tolerance: Patient tolerated treatment well Patient left: in chair;with call bell/phone within reach     Time: 1422-1446 PT Time Calculation (min) (ACUTE  ONLY): 24 min  Charges:  $Gait Training: 8-22 mins $Therapeutic Exercise: 8-22 mins                    G CodesDonell Sievert:      Shantaya Bluestone N, [place]PT  [phone] 02/06/2015, 4:06 PM

## 2015-02-06 NOTE — Progress Notes (Signed)
Patient ID: [name], male   DOB: 08-28-1956, 59 y.o.   MRN: [id number] Operative day 1 left total hip arthroplasty. Patient moves his ankle and toes well. Patient complains of increasing pain. We will increase Toradol to 30 mg every 6 hours. Physical therapy weightbearing as tolerated.

## 2015-02-06 NOTE — Progress Notes (Signed)
Patient continues to c/o pain following Oxycodone 10 mg PO; rating Lt Hip surgical pain "10" [0-10] scale. Additionally c/o "my back is itching...starts after my pain medication." States his back has been itching since he started this Rx at home, "asked my son to bring in my back scratcher." Back mild diffuse raised papular rash. Notified Dr. Lajoyce Cornersuda difficulty managing pain and Hx of Rx pain addiction following previous ortho surgical procedures [this RN has discussed with patient at bedside with managing pain]. Verbal orders received - instruct patient continue with Benadryl PRN to manage itching as needed. At 1347: Dilaudid 1 mg IV, O2 SATs 100%. At 1625: Oxycodone 20 mg PO, Tylenol 650 mg PO, additional fresh ice to Lt hip, repositioned. Pain report 30 minutes = 6; 1730 = 5.

## 2015-02-06 NOTE — Evaluation (Signed)
Physical Therapy Evaluation Patient Details Name: Daniel LindauRandall K Olsen MRN: [id number] DOB: 05/22/1956 Today's Date: 02/06/2015   History of Present Illness  Pt is a 59 y.o. s/p L posterior THA for avascular necrosis on 02/05/15. PMH: HTN, depression, seizures, Left rotator cuff repair, R THA.   Clinical Impression  Pt is s/p Lt posterior THA POD#1 resulting in the deficits listed below (see PT Problem List). Pt will benefit from skilled PT to increase their independence and safety with mobility to allow discharge to the venue listed below. Pt will have daughter (A) PRN, needs to be mod I from mobility standpoint to D/C home safely.     Follow Up Recommendations Home health PT;Supervision/Assistance - 24 hour    Equipment Recommendations  3in1 (PT)    Recommendations for Other Services       Precautions / Restrictions Precautions Precautions: Posterior Hip;Fall Precaution Booklet Issued: Yes (comment) Precaution Comments: reviewed handout with pt Restrictions Weight Bearing Restrictions: Yes LLE Weight Bearing: Weight bearing as tolerated      Mobility  Bed Mobility Overal bed mobility: Needs Assistance Bed Mobility: Supine to Sit     Supine to sit: Min guard;HOB elevated     General bed mobility comments: up in chair  Transfers Overall transfer level: Needs assistance Equipment used: Rolling walker (2 wheeled) Transfers: Sit to/from Stand Sit to Stand: Min guard Stand pivot transfers: Min assist       General transfer comment: cues for technique; min guard to steady; pt very shaky  Ambulation/Gait Ambulation/Gait assistance: Min guard Ambulation Distance (Feet): 80 Feet Assistive device: Rolling walker (2 wheeled) Gait Pattern/deviations: Step-to pattern;Decreased stance time - left;Decreased step length - right;Antalgic;Narrow base of support;Trunk flexed Gait velocity: decr Gait velocity interpretation: Below normal speed for age/gender General Gait Details:  pt with very guarded and shaky gt; cues for step through gt and upright posture; multimodal cues for hip precautions with directional changes   Stairs            Wheelchair Mobility    Modified Rankin (Stroke Patients Only)       Balance Overall balance assessment: Needs assistance Sitting-balance support: Feet supported;No upper extremity supported Sitting balance-Leahy Scale: Fair     Standing balance support: During functional activity;Bilateral upper extremity supported Standing balance-Leahy Scale: Poor Standing balance comment: RW to balance                             Pertinent Vitals/Pain Pain Assessment: 0-10 Pain Score: 3  Pain Location: Lt hip Pain Descriptors / Indicators: Aching Pain Intervention(s): Monitored during session;Premedicated before session;Repositioned    Home Living Family/patient expects to be discharged to:: Private residence Living Arrangements: Alone Available Help at Discharge: Family;Available PRN/intermittently Type of Home: Mobile home Home Access: Stairs to enter   Entrance Stairs-Number of Steps: 2 Home Layout: One level Home Equipment: Walker - 2 wheels      Prior Function Level of Independence: Independent               Hand Dominance        Extremity/Trunk Assessment   Upper Extremity Assessment: Defer to OT evaluation           Lower Extremity Assessment: LLE deficits/detail   LLE Deficits / Details: guarded due to pain  Cervical / Trunk Assessment: Normal  Communication   Communication: No difficulties  Cognition Arousal/Alertness: Awake/alert Behavior During Therapy: WFL for tasks assessed/performed Overall Cognitive Status: Within  Functional Limits for tasks assessed       Memory: Decreased recall of precautions              General Comments General comments (skin integrity, edema, etc.): given HEP handout     Exercises Total Joint Exercises Ankle Circles/Pumps:  AROM;Both;15 reps Quad Sets: AROM;Left;10 reps;Seated Long Arc Quad: AROM;Left;10 reps;Seated      Assessment/Plan    PT Assessment Patient needs continued PT services  PT Diagnosis Difficulty walking;Generalized weakness;Acute pain   PT Problem List Decreased range of motion;Decreased strength;Decreased activity tolerance;Decreased balance;Decreased mobility;Decreased knowledge of precautions;Decreased knowledge of use of DME;Pain  PT Treatment Interventions DME instruction;Gait training;Stair training;Functional mobility training;Therapeutic activities;Therapeutic exercise;Balance training;Neuromuscular re-education;Patient/family education   PT Goals (Current goals can be found in the Care Plan section) Acute Rehab PT Goals Patient Stated Goal: to go home PT Goal Formulation: With patient Time For Goal Achievement: 02/13/15 Potential to Achieve Goals: Good    Frequency 7X/week   Barriers to discharge Decreased caregiver support daughter will be in/out; needs to be supervision to  MOD i    Co-evaluation               End of Session Equipment Utilized During Treatment: Gait belt Activity Tolerance: Patient tolerated treatment well Patient left: in chair;with call bell/phone within reach Nurse Communication: Mobility status;Weight bearing status;Precautions         Time: 9604-5409 PT Time Calculation (min) (ACUTE ONLY): 18 min   Charges:   PT Evaluation $Initial PT Evaluation Tier I: 1 Procedure     PT G CodesDonell Sievert, [place]  [phone] 02/06/2015, 11:44 AM

## 2015-02-06 NOTE — Evaluation (Signed)
Occupational Therapy Evaluation Patient Details Name: Daniel LindauRandall K Iles MRN: [id number] DOB: Mar 18, 1956 Today's Date: 02/06/2015    History of Present Illness Pt is a 59 y.o. s/p L posterior THA for avascular necrosis on 02/05/15. PMH: HTN, depression, seizures, Left rotator cuff repair, R THA.    Clinical Impression   PTA pt lived at home alone and was independent with ADLs and functional mobility. Pt currently limited by severe pain and decreased LLE ROM which impair his independence with ADLs. Pt will benefit from acute OT to address independence with LB ADLs and functional mobility.      Follow Up Recommendations  No OT follow up;Supervision/Assistance - 24 hour    Equipment Recommendations  3 in 1 bedside comode    Recommendations for Other Services       Precautions / Restrictions Precautions Precautions: Posterior Hip;Fall Precaution Booklet Issued: Yes (comment) Precaution Comments: Educated pt on 3/3 posterior hip precautions and incorporating into ADLs.  Restrictions Weight Bearing Restrictions: Yes LLE Weight Bearing: Weight bearing as tolerated      Mobility Bed Mobility Overal bed mobility: Needs Assistance Bed Mobility: Supine to Sit     Supine to sit: Min guard;HOB elevated     General bed mobility comments: No physical assist needed. VC's and use of bed rail with HOB elevated.   Transfers Overall transfer level: Needs assistance Equipment used: Rolling walker (2 wheeled) Transfers: Sit to/from UGI CorporationStand;Stand Pivot Transfers Sit to Stand: Min assist Stand pivot transfers: Min assist       General transfer comment: Min A to power up and for balance. Pt shaking due to weakness and pain. VC's for hand placement and technique         ADL Overall ADL's : Needs assistance/impaired Eating/Feeding: Independent;Sitting   Grooming: Set up;Sitting   Upper Body Bathing: Set up;Sitting   Lower Body Bathing: Minimal assistance;Sit to/from stand    Upper Body Dressing : Set up;Sitting   Lower Body Dressing: Moderate assistance;Sit to/from stand   Toilet Transfer: Minimal assistance;Stand-pivot;RW StatisticianToilet Transfer Details (indicate cue type and reason): bed>recliner Toileting- Clothing Manipulation and Hygiene: Minimal assistance;Sit to/from stand Toileting - Clothing Manipulation Details (indicate cue type and reason): for balance     Functional mobility during ADLs: Minimal assistance;Rolling walker General ADL Comments: Pt limited by pain, but reports feeling better sitting in recliner. Educated pt on OT and PT sessions and encouraged pt to get OOB for meals.                Pertinent Vitals/Pain Pain Assessment: 0-10 Pain Score: 7  Pain Location: Left hip Pain Descriptors / Indicators: Sharp;Shooting Pain Intervention(s): Limited activity within patient's tolerance;Monitored during session;Repositioned;Premedicated before session     Hand Dominance     Extremity/Trunk Assessment Upper Extremity Assessment Upper Extremity Assessment: Overall WFL for tasks assessed   Lower Extremity Assessment Lower Extremity Assessment: Defer to PT evaluation   Cervical / Trunk Assessment Cervical / Trunk Assessment: Normal   Communication Communication Communication: No difficulties   Cognition Arousal/Alertness: Awake/alert Behavior During Therapy: WFL for tasks assessed/performed Overall Cognitive Status: Within Functional Limits for tasks assessed                                Home Living Family/patient expects to be discharged to:: Private residence Living Arrangements: Alone Available Help at Discharge: Family;Available PRN/intermittently Type of Home: Mobile home Home Access: Stairs to enter Entrance Stairs-Number of Steps: 2  Home Layout: One level     Bathroom Shower/Tub: Producer, television/film/video: Standard                Prior Functioning/Environment Level of Independence:  Independent             OT Diagnosis: Generalized weakness;Acute pain   OT Problem List: Decreased strength;Decreased range of motion;Decreased activity tolerance;Impaired balance (sitting and/or standing);Decreased knowledge of use of DME or AE;Decreased knowledge of precautions;Pain   OT Treatment/Interventions: Self-care/ADL training;Therapeutic exercise;Energy conservation;DME and/or AE instruction;Therapeutic activities;Patient/family education;Balance training    OT Goals(Current goals can be found in the care plan section) Acute Rehab OT Goals Patient Stated Goal: to go home OT Goal Formulation: With patient Time For Goal Achievement: 02/20/15 Potential to Achieve Goals: Good ADL Goals Pt Will Perform Grooming: with supervision;standing Pt Will Perform Lower Body Bathing: with set-up;with supervision;with adaptive equipment;sit to/from stand Pt Will Perform Lower Body Dressing: with set-up;with supervision;with adaptive equipment;sit to/from stand Pt Will Transfer to Toilet: with supervision;ambulating;bedside commode Pt Will Perform Toileting - Clothing Manipulation and hygiene: with supervision;sit to/from stand Pt Will Perform Tub/Shower Transfer: Shower transfer;with supervision;ambulating;3 in 1;rolling walker  OT Frequency: Min 2X/week   Barriers to D/C: Decreased caregiver support             End of Session Equipment Utilized During Treatment: Gait belt;Rolling walker  Activity Tolerance: Patient limited by pain Patient left: in chair;with call bell/phone within reach   Time: 0925-0948 OT Time Calculation (min): 23 min Charges:  OT General Charges $OT Visit: 1 Procedure OT Evaluation $Initial OT Evaluation Tier I: 1 Procedure OT Treatments $Self Care/Home Management : 8-22 mins G-Codes:    Rae Lips 2015/02/26, 10:05 AM  Carney Living, OTR/L Occupational Therapist [phone] (pager)

## 2015-02-07 LAB — BASIC METABOLIC PANEL
Anion gap: 9 (ref 5–15)
BUN: 13 mg/dL (ref 6–23)
CALCIUM: 8.3 mg/dL — AB (ref 8.4–10.5)
CO2: 22 mmol/L (ref 19–32)
Chloride: 101 mmol/L (ref 96–112)
Creatinine, Ser: 1.21 mg/dL (ref 0.50–1.35)
GFR calc Af Amer: 76 mL/min — ABNORMAL LOW (ref >90)
GFR calc non Af Amer: 65 mL/min — ABNORMAL LOW (ref >90)
GLUCOSE: 92 mg/dL (ref 70–99)
Potassium: 4.3 mmol/L (ref 3.5–5.1)
Sodium: 132 mmol/L — ABNORMAL LOW (ref 135–145)

## 2015-02-07 LAB — CBC
HEMATOCRIT: 33.6 % — AB (ref 39.0–52.0)
Hemoglobin: 11.1 g/dL — ABNORMAL LOW (ref 13.0–17.0)
MCH: 33.6 pg (ref 26.0–34.0)
MCHC: 33 g/dL (ref 30.0–36.0)
MCV: 101.8 fL — ABNORMAL HIGH (ref 78.0–100.0)
Platelets: 183 10*3/uL (ref 150–400)
RBC: 3.3 MIL/uL — ABNORMAL LOW (ref 4.22–5.81)
RDW: 14.6 % (ref 11.5–15.5)
WBC: 6.7 10*3/uL (ref 4.0–10.5)

## 2015-02-07 MED ORDER — ASPIRIN EC 325 MG PO TBEC: 325.0000 mg | DELAYED_RELEASE_TABLET | Freq: Every day | ORAL | Status: DC

## 2015-02-07 MED ORDER — OXYCODONE-ACETAMINOPHEN 10-325 MG PO TABS
1.0000 | ORAL_TABLET | ORAL | Status: DC | PRN
Start: 2015-02-07 — End: 2015-09-11

## 2015-02-07 MED ORDER — SODIUM CHLORIDE 0.9 % IV BOLUS (SEPSIS)
500.0000 mL | Freq: Once | INTRAVENOUS | Status: AC
  Administered 2015-02-07: 500 mL via INTRAVENOUS

## 2015-02-07 NOTE — Progress Notes (Signed)
Physical Therapy Treatment Patient Details Name: Daniel LindauRandall K Hodgkin MRN: [id number] DOB: June 15, 1956 Today's Date: 02/07/2015    History of Present Illness Pt is a 59 y.o. s/p L posterior THA for avascular necrosis on 02/05/15. PMH: HTN, depression, seizures, Left rotator cuff repair, R THA.     PT Comments    Pt mobilizing better this morning. Pt c/o 6/10 pain. Reviewed stair management, car transfer technique and HEP. Pt would benefit form additional session to reinforce stair management technique. Pt reports his walker at home does not have wheels, will need RW for D/C.   Follow Up Recommendations  Home health PT;Supervision/Assistance - 24 hour     Equipment Recommendations  3in1 (PT);Rolling walker with 5" wheels    Recommendations for Other Services       Precautions / Restrictions Precautions Precautions: Posterior Hip Precaution Comments: pt able to recall 3/3 independently  Restrictions Weight Bearing Restrictions: Yes LLE Weight Bearing: Weight bearing as tolerated    Mobility  Bed Mobility Overal bed mobility: Modified Independent Bed Mobility: Supine to Sit     Supine to sit: Modified independent (Device/Increase time)     General bed mobility comments: up in chair; returned to chair  Transfers Overall transfer level: Needs assistance Equipment used: Rolling walker (2 wheeled) Transfers: Sit to/from Stand Sit to Stand: Modified independent (Device/Increase time) Stand pivot transfers: Supervision       General transfer comment: mod i  Ambulation/Gait Ambulation/Gait assistance: Supervision Ambulation Distance (Feet): 220 Feet Assistive device: Rolling walker (2 wheeled) Gait Pattern/deviations: Step-through pattern;Decreased stride length (decr heel strike on Rt LE ) Gait velocity: guarded Gait velocity interpretation: Below normal speed for age/gender General Gait Details: cues for proper heel strike on Rt LE; min cues for posterior hip precautions     Stairs Stairs: Yes Stairs assistance: Min assist Stair Management: Step to pattern;Backwards;With walker;No rails Number of Stairs: 3 General stair comments: cues for technique; (A) to guard walker; given handout; pt would like to practice with son this afternoon  Wheelchair Mobility    Modified Rankin (Stroke Patients Only)       Balance Overall balance assessment: No apparent balance deficits (not formally assessed)                                  Cognition Arousal/Alertness: Awake/alert Behavior During Therapy: WFL for tasks assessed/performed Overall Cognitive Status: Within Functional Limits for tasks assessed                      Exercises Total Joint Exercises Ankle Circles/Pumps: AROM;Both;15 reps Quad Sets: AROM;Left;10 reps;Seated Gluteal Sets: 15 reps Heel Slides: AAROM;Left;10 reps;Seated Long Arc Quad: AROM;Left;10 reps;Seated Knee Flexion: AROM;Left;10 reps;Standing    General Comments General comments (skin integrity, edema, etc.): reviewed car transfer technique       Pertinent Vitals/Pain Pain Assessment: 0-10 Pain Score: 6  Pain Location: Lt hip  Pain Descriptors / Indicators: Constant;Sore Pain Intervention(s): Monitored during session;Premedicated before session;Repositioned;Ice applied    Home Living                      Prior Function            PT Goals (current goals can now be found in the care plan section) Acute Rehab PT Goals Patient Stated Goal: to go home later today PT Goal Formulation: With patient Time For Goal Achievement: 02/13/15 Potential to  Achieve Goals: Good Progress towards PT goals: Progressing toward goals    Frequency  7X/week    PT Plan Current plan remains appropriate    Co-evaluation             End of Session Equipment Utilized During Treatment: Gait belt Activity Tolerance: Patient tolerated treatment well Patient left: in chair;with call bell/phone within  reach     Time: 0753-0817 PT Time Calculation (min) (ACUTE ONLY): 24 min  Charges:  $Gait Training: 8-22 mins $Therapeutic Exercise: 8-22 mins                    G CodesDonell Sievert, [place]  [phone] 02/07/2015, 9:47 AM

## 2015-02-07 NOTE — Progress Notes (Signed)
Occupational Therapy Treatment Patient Details Name: [name] MRN: [id number] DOB: 12-24-1956 Today's Date: 02/07/2015    History of present illness Pt is a 59 y.o. s/p L posterior THA for avascular necrosis on 02/05/15. PMH: HTN, depression, seizures, Left rotator cuff repair, R THA.    OT comments  Pt. Progressing well with acute OT goals.  Mod I for bed mobility, S for toileting and shower stall transfer.  Plans for d/c home later today.  Follow Up Recommendations  No OT follow up;Supervision/Assistance - 24 hour    Equipment Recommendations  3 in 1 bedside comode    Recommendations for Other Services      Precautions / Restrictions Precautions Precautions: Posterior Hip Restrictions LLE Weight Bearing: Weight bearing as tolerated       Mobility Bed Mobility Overal bed mobility: Modified Independent Bed Mobility: Supine to Sit     Supine to sit: Modified independent (Device/Increase time)     General bed mobility comments: hob flat, no rail pt. able to sit upright and bring b les out of bed  Transfers Overall transfer level: Needs assistance Equipment used: Rolling walker (2 wheeled) Transfers: Sit to/from [employer] Sit to Stand: Supervision Stand pivot transfers: Supervision       General transfer comment: min cues for technique     Balance                                   ADL Overall ADL's : Needs assistance/impaired     Grooming: Supervision/safety;Standing                   Toilet Transfer: Supervision/safety;RW;Regular Toilet;Grab bars   Toileting- Clothing Manipulation and Hygiene: Supervision/safety;Sitting/lateral lean   Tub/ Shower Transfer: Walk-in shower;Min guard;Ambulation;Anterior/posterior Tub/Shower Transfer Details (indicate cue type and reason): able to return demo of stepping over ledge posteriorly for shower stall transfer Functional mobility during ADLs: Supervision/safety;Rolling  walker General ADL Comments: reports "feeling much better  today"      Vision                     Perception     Praxis      Cognition   Behavior During Therapy: [facility] for tasks assessed/performed Overall Cognitive Status: Within Functional Limits for tasks assessed                       Extremity/Trunk Assessment               Exercises     Shoulder Instructions       General Comments      Pertinent Vitals/ Pain       Pain Location: L hip Pain Descriptors / Indicators: Aching Pain Intervention(s): Monitored during session;RN gave pain meds during session  Home Living                                          Prior Functioning/Environment              Frequency Min 2X/week     Progress Toward Goals  OT Goals(current goals can now be found in the care plan section)  Progress towards OT goals: Progressing toward goals     Plan Discharge plan remains appropriate    Co-evaluation  End of Session Equipment Utilized During Treatment: Gait belt;Rolling walker   Activity Tolerance Patient tolerated treatment well   Patient Left in chair;with call bell/phone within reach   Nurse Communication          Time: 0650-0710 OT Time Calculation (min): 20 min  Charges: OT General Charges $OT Visit: 1 Procedure OT Treatments $Self Care/Home Management : 8-22 mins  Robet LeuMorris, Cailen Mihalik Lorraine, COTA/L 02/07/2015, 7:18 AM

## 2015-02-07 NOTE — Discharge Summary (Signed)
Physician Discharge Summary  Patient ID: [name] MRN: [id number] DOB/AGE: 1956/05/07 59 y.o.  Admit date: 02/05/2015 Discharge date: 02/07/2015  Admission Diagnoses:avascular necrosis left hip  Discharge Diagnoses:  Active Problems:   Avascular necrosis of hip   Discharged Condition: stable  Hospital Course: patient's hospital course was essentially unremarkable. He underwent total hip arthroplasty. Postoperatively patient progressed well and was discharged to home in stable condition.  Consults: None  Significant Diagnostic Studies: labs: routine labs  Treatments:see operative note Discharge Exam: Blood pressure 97/62, pulse 75, temperature 98 F (36.7 C), temperature source Oral, resp. rate 16, height 5\' 8"  (1.727 m), weight 71.668 kg (158 lb), SpO2 94 %. Incision/Wound:dressing clean dry and intact  [facility]  Discharge Instructions    Call MD / Call 911    Complete by:  As directed   If you experience chest pain or shortness of breath, CALL 911 and be transported to the hospital emergency room.  If you develope a fever above 101 F, pus (white drainage) or increased drainage or redness at the wound, or calf pain, call your surgeon's office.     Constipation Prevention    Complete by:  As directed   Drink plenty of fluids.  Prune juice may be helpful.  You may use a stool softener, such as Colace (over the counter) 100 mg twice a day.  Use MiraLax (over the counter) for constipation as needed.     Diet - low sodium heart healthy    Complete by:  As directed      Increase activity slowly as tolerated    Complete by:  As directed      Weight bearing as tolerated    Complete by:  As directed             Medication List    TAKE these medications        aspirin EC 325 MG tablet  Take 1 tablet (325 mg total) by mouth daily.     citalopram 20 MG tablet  Commonly known as:  CELEXA  Take 20 mg by mouth daily.     ibuprofen 400 MG  tablet  Commonly known as:  ADVIL,MOTRIN  Take 400 mg by mouth every 6 (six) hours as needed.     levETIRAcetam 750 MG tablet  Commonly known as:  KEPPRA  Take 750 mg by mouth 2 (two) times daily.     lisinopril 40 MG tablet  Commonly known as:  PRINIVIL,ZESTRIL  Take 40 mg by mouth daily.     oxyCODONE-acetaminophen 5-325 MG per tablet  Commonly known as:  PERCOCET/ROXICET  Take 1 tablet by mouth every 4 (four) hours as needed for severe pain.     oxyCODONE-acetaminophen 10-325 MG per tablet  Commonly known as:  PERCOCET  Take 1 tablet by mouth every 4 (four) hours as needed for pain.           Follow-up Information    [facility]   Why:  They will contact you to schedule home therapy visits.   Contact information:   [address] [place] [address] [phone]       Follow up with Nadara MustardUDA,MARCUS V, MD.   Specialty:  Orthopedic Surgery   Contact information:   [address] [place] [address] [phone]       Signed: Nadara MustardDUDA,MARCUS V 02/07/2015, 6:40 AM

## 2015-02-07 NOTE — Progress Notes (Signed)
Patient with low BP this am.  Dr. Lajoyce Cornersuda aware, no new orders at this time.

## 2015-02-07 NOTE — Progress Notes (Signed)
Patient d/c to home with family.  Prescriptions given, instructions reviewed.

## 2015-02-07 NOTE — Progress Notes (Signed)
Physical Therapy Treatment Patient Details Name: Daniel LindauRandall K Schommer MRN: [id number] DOB: October 22, 1956 Today's Date: 02/07/2015    History of Present Illness Pt is a 59 y.o. s/p L posterior THA for avascular necrosis on 02/05/15. PMH: HTN, depression, seizures, Left rotator cuff repair, R THA.     PT Comments    Pt demo good recall of stair management technique; able to perform teach back technique. Reviewed car transfer technique and home setup safety techniques. Pt safe from PT standpoint to D/C home today.   Follow Up Recommendations  Home health PT;Supervision/Assistance - 24 hour     Equipment Recommendations  3in1 (PT);Rolling walker with 5" wheels    Recommendations for Other Services       Precautions / Restrictions Precautions Precautions: Posterior Hip Precaution Comments: pt able to recall 3/3  Restrictions Weight Bearing Restrictions: Yes LLE Weight Bearing: Weight bearing as tolerated    Mobility  Bed Mobility Overal bed mobility: Modified Independent             General bed mobility comments: bed flattened  Transfers Overall transfer level: Modified independent Equipment used: Rolling walker (2 wheeled) Transfers: Sit to/from Stand Sit to Stand: Modified independent (Device/Increase time)         General transfer comment: mod i  Ambulation/Gait Ambulation/Gait assistance: Supervision Ambulation Distance (Feet): 300 Feet Assistive device: Rolling walker (2 wheeled) Gait Pattern/deviations: Step-through pattern;Decreased stride length Gait velocity: guarded Gait velocity interpretation: Below normal speed for age/gender General Gait Details: pt able to ambulate with step through gt technique; min cues for hip precautions and RW management    Stairs Stairs: Yes Stairs assistance: Min assist Stair Management: No rails;Step to pattern;Backwards;With walker Number of Stairs: 3 General stair comments: had pt perform teachback technique to reinforce  recall   Wheelchair Mobility    Modified Rankin (Stroke Patients Only)       Balance Overall balance assessment: No apparent balance deficits (not formally assessed)                                  Cognition Arousal/Alertness: Awake/alert Behavior During Therapy: Anxious Overall Cognitive Status: Within Functional Limits for tasks assessed                      Exercises Total Joint Exercises Ankle Circles/Pumps: AROM;Both;15 reps Quad Sets: AROM;Left;10 reps;Seated Gluteal Sets: 15 reps Short Arc Quad: AROM;Left;10 reps;Supine Heel Slides: AAROM;Left;10 reps;Seated Hip ABduction/ADduction: AAROM;Left;10 reps;Seated Long Arc Quad: AROM;Left;10 reps;Seated Knee Flexion: AROM;Left;10 reps;Standing Marching in Standing: AROM;Left;10 reps;Standing    General Comments General comments (skin integrity, edema, etc.): reviewed car transfer and home setup safety techniques       Pertinent Vitals/Pain Pain Assessment: 0-10 Pain Score: 8  Pain Location: Lt hip Pain Descriptors / Indicators: Constant;Throbbing Pain Intervention(s): Limited activity within patient's tolerance;Monitored during session;Premedicated before session;Repositioned;Ice applied    Home Living                      Prior Function            PT Goals (current goals can now be found in the care plan section) Acute Rehab PT Goals Patient Stated Goal: home around 2 PT Goal Formulation: With patient Time For Goal Achievement: 02/13/15 Potential to Achieve Goals: Good Progress towards PT goals: Progressing toward goals    Frequency  7X/week    PT Plan Current plan  remains appropriate    Co-evaluation             End of Session Equipment Utilized During Treatment: Gait belt Activity Tolerance: Patient tolerated treatment well Patient left: in bed;with call bell/phone within reach     Time: 1255-1319 PT Time Calculation (min) (ACUTE ONLY): 24 min  Charges:   $Gait Training: 8-22 mins $Therapeutic Exercise: 8-22 mins                    G CodesDonell Sievert, [place]  [phone] 02/07/2015, 1:31 PM

## 2015-02-09 DIAGNOSIS — G40909 Epilepsy, unspecified, not intractable, without status epilepticus: Secondary | ICD-10-CM | POA: Diagnosis not present

## 2015-02-09 DIAGNOSIS — I10 Essential (primary) hypertension: Secondary | ICD-10-CM | POA: Diagnosis not present

## 2015-02-09 DIAGNOSIS — Z471 Aftercare following joint replacement surgery: Secondary | ICD-10-CM | POA: Diagnosis not present

## 2015-02-09 DIAGNOSIS — F329 Major depressive disorder, single episode, unspecified: Secondary | ICD-10-CM | POA: Diagnosis not present

## 2015-02-09 DIAGNOSIS — Z9181 History of falling: Secondary | ICD-10-CM | POA: Diagnosis not present

## 2015-02-09 DIAGNOSIS — Z96643 Presence of artificial hip joint, bilateral: Secondary | ICD-10-CM | POA: Diagnosis not present

## 2015-02-11 ENCOUNTER — Encounter (HOSPITAL_COMMUNITY): Payer: Self-pay | Admitting: Orthopedic Surgery

## 2015-02-11 DIAGNOSIS — I10 Essential (primary) hypertension: Secondary | ICD-10-CM | POA: Diagnosis not present

## 2015-02-11 DIAGNOSIS — F329 Major depressive disorder, single episode, unspecified: Secondary | ICD-10-CM | POA: Diagnosis not present

## 2015-02-11 DIAGNOSIS — Z9181 History of falling: Secondary | ICD-10-CM | POA: Diagnosis not present

## 2015-02-11 DIAGNOSIS — Z96643 Presence of artificial hip joint, bilateral: Secondary | ICD-10-CM | POA: Diagnosis not present

## 2015-02-11 DIAGNOSIS — Z471 Aftercare following joint replacement surgery: Secondary | ICD-10-CM | POA: Diagnosis not present

## 2015-02-11 DIAGNOSIS — G40909 Epilepsy, unspecified, not intractable, without status epilepticus: Secondary | ICD-10-CM | POA: Diagnosis not present

## 2015-02-14 DIAGNOSIS — F329 Major depressive disorder, single episode, unspecified: Secondary | ICD-10-CM | POA: Diagnosis not present

## 2015-02-14 DIAGNOSIS — Z9181 History of falling: Secondary | ICD-10-CM | POA: Diagnosis not present

## 2015-02-14 DIAGNOSIS — Z96643 Presence of artificial hip joint, bilateral: Secondary | ICD-10-CM | POA: Diagnosis not present

## 2015-02-14 DIAGNOSIS — Z471 Aftercare following joint replacement surgery: Secondary | ICD-10-CM | POA: Diagnosis not present

## 2015-02-14 DIAGNOSIS — I10 Essential (primary) hypertension: Secondary | ICD-10-CM | POA: Diagnosis not present

## 2015-02-14 DIAGNOSIS — G40909 Epilepsy, unspecified, not intractable, without status epilepticus: Secondary | ICD-10-CM | POA: Diagnosis not present

## 2015-02-17 DIAGNOSIS — I10 Essential (primary) hypertension: Secondary | ICD-10-CM | POA: Diagnosis not present

## 2015-02-17 DIAGNOSIS — Z96643 Presence of artificial hip joint, bilateral: Secondary | ICD-10-CM | POA: Diagnosis not present

## 2015-02-17 DIAGNOSIS — Z9181 History of falling: Secondary | ICD-10-CM | POA: Diagnosis not present

## 2015-02-17 DIAGNOSIS — Z471 Aftercare following joint replacement surgery: Secondary | ICD-10-CM | POA: Diagnosis not present

## 2015-02-17 DIAGNOSIS — G40909 Epilepsy, unspecified, not intractable, without status epilepticus: Secondary | ICD-10-CM | POA: Diagnosis not present

## 2015-02-17 DIAGNOSIS — F329 Major depressive disorder, single episode, unspecified: Secondary | ICD-10-CM | POA: Diagnosis not present

## 2015-02-21 DIAGNOSIS — G40909 Epilepsy, unspecified, not intractable, without status epilepticus: Secondary | ICD-10-CM | POA: Diagnosis not present

## 2015-02-21 DIAGNOSIS — M25551 Pain in right hip: Secondary | ICD-10-CM | POA: Diagnosis not present

## 2015-02-21 DIAGNOSIS — Z471 Aftercare following joint replacement surgery: Secondary | ICD-10-CM | POA: Diagnosis not present

## 2015-02-21 DIAGNOSIS — F329 Major depressive disorder, single episode, unspecified: Secondary | ICD-10-CM | POA: Diagnosis not present

## 2015-02-21 DIAGNOSIS — Z9181 History of falling: Secondary | ICD-10-CM | POA: Diagnosis not present

## 2015-02-21 DIAGNOSIS — I10 Essential (primary) hypertension: Secondary | ICD-10-CM | POA: Diagnosis not present

## 2015-02-21 DIAGNOSIS — Z96643 Presence of artificial hip joint, bilateral: Secondary | ICD-10-CM | POA: Diagnosis not present

## 2015-03-03 DIAGNOSIS — Z6826 Body mass index (BMI) 26.0-26.9, adult: Secondary | ICD-10-CM | POA: Diagnosis not present

## 2015-03-03 DIAGNOSIS — M159 Polyosteoarthritis, unspecified: Secondary | ICD-10-CM | POA: Diagnosis not present

## 2015-03-03 DIAGNOSIS — R569 Unspecified convulsions: Secondary | ICD-10-CM | POA: Diagnosis not present

## 2015-03-03 DIAGNOSIS — F5101 Primary insomnia: Secondary | ICD-10-CM | POA: Diagnosis not present

## 2015-03-03 DIAGNOSIS — I10 Essential (primary) hypertension: Secondary | ICD-10-CM | POA: Diagnosis not present

## 2015-03-03 DIAGNOSIS — F329 Major depressive disorder, single episode, unspecified: Secondary | ICD-10-CM | POA: Diagnosis not present

## 2015-03-03 DIAGNOSIS — F411 Generalized anxiety disorder: Secondary | ICD-10-CM | POA: Diagnosis not present

## 2015-03-13 DIAGNOSIS — M25552 Pain in left hip: Secondary | ICD-10-CM | POA: Diagnosis not present

## 2015-03-13 DIAGNOSIS — M87052 Idiopathic aseptic necrosis of left femur: Secondary | ICD-10-CM | POA: Diagnosis not present

## 2015-05-22 DIAGNOSIS — M25552 Pain in left hip: Secondary | ICD-10-CM | POA: Diagnosis not present

## 2015-07-30 DIAGNOSIS — M25551 Pain in right hip: Secondary | ICD-10-CM | POA: Diagnosis not present

## 2015-07-30 DIAGNOSIS — M4726 Other spondylosis with radiculopathy, lumbar region: Secondary | ICD-10-CM | POA: Diagnosis not present

## 2015-07-30 DIAGNOSIS — M25552 Pain in left hip: Secondary | ICD-10-CM | POA: Diagnosis not present

## 2015-09-04 DIAGNOSIS — M25552 Pain in left hip: Secondary | ICD-10-CM | POA: Diagnosis not present

## 2015-09-10 ENCOUNTER — Emergency Department (HOSPITAL_COMMUNITY)
Admission: EM | Admit: 2015-09-10 | Discharge: 2015-09-11 | Disposition: A | Discharge status: Home / Self Care | Payer: Medicare Other | Attending: Emergency Medicine | Admitting: Emergency Medicine

## 2015-09-10 ENCOUNTER — Encounter (HOSPITAL_COMMUNITY): Payer: Self-pay | Admitting: *Deleted

## 2015-09-10 DIAGNOSIS — R1013 Epigastric pain: Secondary | ICD-10-CM | POA: Insufficient documentation

## 2015-09-10 DIAGNOSIS — G40909 Epilepsy, unspecified, not intractable, without status epilepticus: Secondary | ICD-10-CM | POA: Diagnosis not present

## 2015-09-10 DIAGNOSIS — Z87442 Personal history of urinary calculi: Secondary | ICD-10-CM | POA: Diagnosis not present

## 2015-09-10 DIAGNOSIS — F329 Major depressive disorder, single episode, unspecified: Secondary | ICD-10-CM | POA: Diagnosis not present

## 2015-09-10 DIAGNOSIS — I10 Essential (primary) hypertension: Secondary | ICD-10-CM | POA: Insufficient documentation

## 2015-09-10 DIAGNOSIS — Z79899 Other long term (current) drug therapy: Secondary | ICD-10-CM | POA: Diagnosis not present

## 2015-09-10 DIAGNOSIS — K209 Esophagitis, unspecified without bleeding: Secondary | ICD-10-CM

## 2015-09-10 DIAGNOSIS — R079 Chest pain, unspecified: Secondary | ICD-10-CM | POA: Diagnosis present

## 2015-09-10 DIAGNOSIS — R000 Tachycardia, unspecified: Secondary | ICD-10-CM | POA: Insufficient documentation

## 2015-09-10 DIAGNOSIS — R531 Weakness: Secondary | ICD-10-CM | POA: Diagnosis not present

## 2015-09-10 DIAGNOSIS — R1110 Vomiting, unspecified: Secondary | ICD-10-CM | POA: Diagnosis not present

## 2015-09-10 LAB — I-STAT TROPONIN, ED: TROPONIN I, POC: 0.01 ng/mL (ref 0.00–0.08)

## 2015-09-10 LAB — I-STAT CG4 LACTIC ACID, ED: LACTIC ACID, VENOUS: 2.15 mmol/L — AB (ref 0.5–2.0)

## 2015-09-10 MED ORDER — SODIUM CHLORIDE 0.9 % IV BOLUS (SEPSIS)
2000.0000 mL | Freq: Once | INTRAVENOUS | Status: AC
  Administered 2015-09-10: 2000 mL via INTRAVENOUS

## 2015-09-10 MED ORDER — RANITIDINE HCL 150 MG/10ML PO SYRP
300.0000 mg | ORAL_SOLUTION | Freq: Once | ORAL | Status: AC
  Administered 2015-09-11: 300 mg via ORAL
  Filled 2015-09-10: qty 20

## 2015-09-10 MED ORDER — IOHEXOL 300 MG/ML  SOLN
25.0000 mL | Freq: Once | INTRAMUSCULAR | Status: AC | PRN
  Administered 2015-09-10: 25 mL via ORAL

## 2015-09-10 MED ORDER — ALUM & MAG HYDROXIDE-SIMETH 200-200-20 MG/5ML PO SUSP
30.0000 mL | Freq: Once | ORAL | Status: AC
  Administered 2015-09-10: 30 mL via ORAL
  Filled 2015-09-10: qty 30

## 2015-09-10 NOTE — ED Notes (Signed)
Pt to ED from home via [facility] c/o centralized chest pain/epigastric pressure onset tonight. Pt reports NV with dark emesis and dizziness. EMS reports positive orthostatics with increase of HR from 130-160 with position changes. Pt received 1L bolus enroute and  Took a BC powder prior to arrival

## 2015-09-10 NOTE — ED Notes (Signed)
Contrast at bedside  
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
## 2015-09-11 ENCOUNTER — Encounter (HOSPITAL_COMMUNITY): Payer: Self-pay | Admitting: Radiology

## 2015-09-11 ENCOUNTER — Emergency Department (HOSPITAL_COMMUNITY): Payer: Medicare Other

## 2015-09-11 DIAGNOSIS — R1013 Epigastric pain: Secondary | ICD-10-CM | POA: Diagnosis not present

## 2015-09-11 DIAGNOSIS — K209 Esophagitis, unspecified: Secondary | ICD-10-CM | POA: Diagnosis not present

## 2015-09-11 LAB — COMPREHENSIVE METABOLIC PANEL
ALT: 14 U/L — AB (ref 17–63)
AST: 17 U/L (ref 15–41)
Albumin: 2.8 g/dL — ABNORMAL LOW (ref 3.5–5.0)
Alkaline Phosphatase: 73 U/L (ref 38–126)
Anion gap: 12 (ref 5–15)
BUN: 27 mg/dL — ABNORMAL HIGH (ref 6–20)
CHLORIDE: 104 mmol/L (ref 101–111)
CO2: 16 mmol/L — AB (ref 22–32)
CREATININE: 0.97 mg/dL (ref 0.61–1.24)
Calcium: 7.9 mg/dL — ABNORMAL LOW (ref 8.9–10.3)
Glucose, Bld: 91 mg/dL (ref 65–99)
POTASSIUM: 3.5 mmol/L (ref 3.5–5.1)
SODIUM: 132 mmol/L — AB (ref 135–145)
Total Bilirubin: 0.3 mg/dL (ref 0.3–1.2)
Total Protein: 5.7 g/dL — ABNORMAL LOW (ref 6.5–8.1)

## 2015-09-11 LAB — CBC WITH DIFFERENTIAL/PLATELET
BASOS ABS: 0 10*3/uL (ref 0.0–0.1)
Basophils Relative: 0 %
EOS ABS: 0.1 10*3/uL (ref 0.0–0.7)
EOS PCT: 2 %
HCT: 35.8 % — ABNORMAL LOW (ref 39.0–52.0)
Hemoglobin: 12.4 g/dL — ABNORMAL LOW (ref 13.0–17.0)
LYMPHS ABS: 2.2 10*3/uL (ref 0.7–4.0)
Lymphocytes Relative: 34 %
MCH: 34.1 pg — AB (ref 26.0–34.0)
MCHC: 34.6 g/dL (ref 30.0–36.0)
MCV: 98.4 fL (ref 78.0–100.0)
MONO ABS: 0.5 10*3/uL (ref 0.1–1.0)
Monocytes Relative: 7 %
Neutro Abs: 3.6 10*3/uL (ref 1.7–7.7)
Neutrophils Relative %: 57 %
PLATELETS: 224 10*3/uL (ref 150–400)
RBC: 3.64 MIL/uL — AB (ref 4.22–5.81)
RDW: 13.6 % (ref 11.5–15.5)
WBC: 6.3 10*3/uL (ref 4.0–10.5)

## 2015-09-11 LAB — URINALYSIS, ROUTINE W REFLEX MICROSCOPIC
Bilirubin Urine: NEGATIVE
Glucose, UA: NEGATIVE mg/dL
Hgb urine dipstick: NEGATIVE
KETONES UR: 15 mg/dL — AB
LEUKOCYTES UA: NEGATIVE
NITRITE: NEGATIVE
PROTEIN: NEGATIVE mg/dL
Specific Gravity, Urine: 1.015 (ref 1.005–1.030)
UROBILINOGEN UA: 0.2 mg/dL (ref 0.0–1.0)
pH: 5 (ref 5.0–8.0)

## 2015-09-11 LAB — LIPASE, BLOOD: LIPASE: 48 U/L (ref 22–51)

## 2015-09-11 MED ORDER — MORPHINE SULFATE (PF) 4 MG/ML IV SOLN
4.0000 mg | Freq: Once | INTRAVENOUS | Status: AC
  Administered 2015-09-11: 4 mg via INTRAVENOUS
  Filled 2015-09-11: qty 1

## 2015-09-11 MED ORDER — IOHEXOL 300 MG/ML  SOLN
100.0000 mL | Freq: Once | INTRAMUSCULAR | Status: AC | PRN
  Administered 2015-09-11: 100 mL via INTRAVENOUS

## 2015-09-11 MED ORDER — PANTOPRAZOLE SODIUM 20 MG PO TBEC: 20.0000 mg | DELAYED_RELEASE_TABLET | Freq: Every day | ORAL | Status: DC

## 2015-09-11 MED ORDER — SODIUM CHLORIDE 0.9 % IV BOLUS (SEPSIS)
1000.0000 mL | Freq: Once | INTRAVENOUS | Status: AC
  Administered 2015-09-11: 1000 mL via INTRAVENOUS

## 2015-09-11 NOTE — Discharge Instructions (Signed)
Esophagitis Mr. Fitz, take protonix daily until you see your primary care doctor within 3 days for close follow up.  Your CT scan shows esophagitis.  If any symptoms worsen, come back to the ED immediately. Thank you. Esophagitis is inflammation of the esophagus. It can involve swelling, soreness, and pain in the esophagus. This condition can make it difficult and painful to swallow. CAUSES  Most causes of esophagitis are not serious. Many different factors can cause esophagitis, including:  Gastroesophageal reflux disease (GERD). This is when acid from your stomach flows up into the esophagus.  Recurrent vomiting.  An allergic-type reaction.  Certain medicines, especially those that come in large pills.  Ingestion of harmful chemicals, such as household cleaning products.  Heavy alcohol use.  An infection of the esophagus.  Radiation treatment for cancer.  Certain diseases such as sarcoidosis, Crohn's disease, and scleroderma. These diseases may cause recurrent esophagitis. SYMPTOMS   Trouble swallowing.  Painful swallowing.  Chest pain.  Difficulty breathing.  Nausea.  Vomiting.  Abdominal pain. DIAGNOSIS  Your caregiver will take your history and do a physical exam. Depending upon what your caregiver finds, certain tests may also be done, including:  Barium X-ray. You will drink a solution that coats the esophagus, and X-rays will be taken.  Endoscopy. A lighted tube is put down the esophagus so your caregiver can examine the area.  Allergy tests. These can sometimes be arranged through follow-up visits. TREATMENT  Treatment will depend on the cause of your esophagitis. In some cases, steroids or other medicines may be given to help relieve your symptoms or to treat the underlying cause of your condition. Medicines that may be recommended include:  Viscous lidocaine, to soothe the esophagus.  Antacids.  Acid reducers.  Proton pump inhibitors.  Antiviral  medicines for certain viral infections of the esophagus.  Antifungal medicines for certain fungal infections of the esophagus.  Antibiotic medicines, depending on the cause of the esophagitis. HOME CARE INSTRUCTIONS   Avoid foods and drinks that seem to make your symptoms worse.  Eat small, frequent meals instead of large meals.  Avoid eating for the 3 hours prior to your bedtime.  If you have trouble taking pills, use a pill splitter to decrease the size and likelihood of the pill getting stuck or injuring the esophagus on the way down. Drinking water after taking a pill also helps.  Stop smoking if you smoke.  Maintain a healthy weight.  Wear loose-fitting clothing. Do not wear anything tight around your waist that causes pressure on your stomach.  Raise the head of your bed 6 to 8 inches with wood blocks to help you sleep. Extra pillows will not help.  Only take over-the-counter or prescription medicines as directed by your caregiver. SEEK IMMEDIATE MEDICAL CARE IF:  You have severe chest pain that radiates into your arm, neck, or jaw.  You feel sweaty, dizzy, or lightheaded.  You have shortness of breath.  You vomit blood.  You have difficulty or pain with swallowing.  You have bloody or black, tarry stools.  You have a fever.  You have a burning sensation in the chest more than 3 times a week for more than 2 weeks.  You cannot swallow, drink, or eat.  You drool because you cannot swallow your saliva. MAKE SURE YOU:  Understand these instructions.  Will watch your condition.  Will get help right away if you are not doing well or get worse. Document Released: 01/20/2005 Document Revised: 03/06/2012  Document Reviewed: 08/13/2011 [facility] Patient Information 2015 [place], [place]. This information is not intended to replace advice given to you by your health care provider. Make sure you discuss any questions you have with your health care provider.

## 2015-12-23 DIAGNOSIS — F411 Generalized anxiety disorder: Secondary | ICD-10-CM | POA: Diagnosis not present

## 2015-12-23 DIAGNOSIS — I10 Essential (primary) hypertension: Secondary | ICD-10-CM | POA: Diagnosis not present

## 2015-12-23 DIAGNOSIS — Z6826 Body mass index (BMI) 26.0-26.9, adult: Secondary | ICD-10-CM | POA: Diagnosis not present

## 2015-12-23 DIAGNOSIS — R569 Unspecified convulsions: Secondary | ICD-10-CM | POA: Diagnosis not present

## 2015-12-23 DIAGNOSIS — M159 Polyosteoarthritis, unspecified: Secondary | ICD-10-CM | POA: Diagnosis not present

## 2015-12-23 DIAGNOSIS — F5101 Primary insomnia: Secondary | ICD-10-CM | POA: Diagnosis not present

## 2015-12-23 DIAGNOSIS — F329 Major depressive disorder, single episode, unspecified: Secondary | ICD-10-CM | POA: Diagnosis not present

## 2016-01-02 DIAGNOSIS — Z79899 Other long term (current) drug therapy: Secondary | ICD-10-CM | POA: Diagnosis not present

## 2016-01-05 DIAGNOSIS — R05 Cough: Secondary | ICD-10-CM | POA: Diagnosis not present

## 2016-01-05 DIAGNOSIS — I10 Essential (primary) hypertension: Secondary | ICD-10-CM | POA: Diagnosis not present

## 2016-01-05 DIAGNOSIS — M159 Polyosteoarthritis, unspecified: Secondary | ICD-10-CM | POA: Diagnosis not present

## 2016-01-05 DIAGNOSIS — Z6826 Body mass index (BMI) 26.0-26.9, adult: Secondary | ICD-10-CM | POA: Diagnosis not present

## 2016-01-05 DIAGNOSIS — F329 Major depressive disorder, single episode, unspecified: Secondary | ICD-10-CM | POA: Diagnosis not present

## 2016-01-05 DIAGNOSIS — E663 Overweight: Secondary | ICD-10-CM | POA: Diagnosis not present

## 2016-01-05 DIAGNOSIS — R569 Unspecified convulsions: Secondary | ICD-10-CM | POA: Diagnosis not present

## 2016-01-19 DIAGNOSIS — I10 Essential (primary) hypertension: Secondary | ICD-10-CM | POA: Diagnosis not present

## 2016-01-19 DIAGNOSIS — F5101 Primary insomnia: Secondary | ICD-10-CM | POA: Diagnosis not present

## 2016-01-19 DIAGNOSIS — R569 Unspecified convulsions: Secondary | ICD-10-CM | POA: Diagnosis not present

## 2016-01-19 DIAGNOSIS — R05 Cough: Secondary | ICD-10-CM | POA: Diagnosis not present

## 2016-01-19 DIAGNOSIS — M159 Polyosteoarthritis, unspecified: Secondary | ICD-10-CM | POA: Diagnosis not present

## 2016-01-19 DIAGNOSIS — Z6825 Body mass index (BMI) 25.0-25.9, adult: Secondary | ICD-10-CM | POA: Diagnosis not present

## 2016-02-19 DIAGNOSIS — R569 Unspecified convulsions: Secondary | ICD-10-CM | POA: Diagnosis not present

## 2016-02-19 DIAGNOSIS — Z1389 Encounter for screening for other disorder: Secondary | ICD-10-CM | POA: Diagnosis not present

## 2016-02-19 DIAGNOSIS — R05 Cough: Secondary | ICD-10-CM | POA: Diagnosis not present

## 2016-02-19 DIAGNOSIS — M159 Polyosteoarthritis, unspecified: Secondary | ICD-10-CM | POA: Diagnosis not present

## 2016-02-19 DIAGNOSIS — F329 Major depressive disorder, single episode, unspecified: Secondary | ICD-10-CM | POA: Diagnosis not present

## 2016-02-19 DIAGNOSIS — I10 Essential (primary) hypertension: Secondary | ICD-10-CM | POA: Diagnosis not present

## 2016-02-19 DIAGNOSIS — F5101 Primary insomnia: Secondary | ICD-10-CM | POA: Diagnosis not present

## 2016-02-19 DIAGNOSIS — Z6824 Body mass index (BMI) 24.0-24.9, adult: Secondary | ICD-10-CM | POA: Diagnosis not present

## 2016-03-01 DIAGNOSIS — R05 Cough: Secondary | ICD-10-CM | POA: Diagnosis not present

## 2016-09-17 ENCOUNTER — Emergency Department (HOSPITAL_COMMUNITY): Payer: Medicare Other

## 2016-09-17 ENCOUNTER — Encounter (HOSPITAL_COMMUNITY): Payer: Self-pay | Admitting: *Deleted

## 2016-09-17 ENCOUNTER — Emergency Department (HOSPITAL_COMMUNITY)
Admission: EM | Admit: 2016-09-17 | Discharge: 2016-09-17 | Disposition: A | Discharge status: Home / Self Care | Payer: Medicare Other | Attending: Emergency Medicine | Admitting: Emergency Medicine

## 2016-09-17 DIAGNOSIS — Y929 Unspecified place or not applicable: Secondary | ICD-10-CM | POA: Insufficient documentation

## 2016-09-17 DIAGNOSIS — W228XXA Striking against or struck by other objects, initial encounter: Secondary | ICD-10-CM | POA: Insufficient documentation

## 2016-09-17 DIAGNOSIS — M25561 Pain in right knee: Secondary | ICD-10-CM

## 2016-09-17 DIAGNOSIS — Y939 Activity, unspecified: Secondary | ICD-10-CM | POA: Diagnosis not present

## 2016-09-17 DIAGNOSIS — Y999 Unspecified external cause status: Secondary | ICD-10-CM | POA: Insufficient documentation

## 2016-09-17 DIAGNOSIS — Z96642 Presence of left artificial hip joint: Secondary | ICD-10-CM | POA: Insufficient documentation

## 2016-09-17 DIAGNOSIS — M25461 Effusion, right knee: Secondary | ICD-10-CM | POA: Diagnosis not present

## 2016-09-17 DIAGNOSIS — I10 Essential (primary) hypertension: Secondary | ICD-10-CM | POA: Diagnosis not present

## 2016-09-17 MED ORDER — NAPROXEN 500 MG PO TABS: 500.0000 mg | ORAL_TABLET | Freq: Two times a day (BID) | ORAL | 0 refills | Status: DC

## 2016-09-17 NOTE — ED Triage Notes (Signed)
The pt is c/o  Rt knee pain since he had a seizure 3 days ago  And injured it then abrasion rt knee and rt elbow
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
## 2016-09-17 NOTE — Progress Notes (Signed)
Orthopedic Tech Progress Note Patient Details:  [name] Oct 26, 1956 [id number]  [name], [name], [name]   Jennye MoccasinHughes, Pedro Whiters Craig 09/17/2016, 6:09 PM

## 2016-11-05 ENCOUNTER — Ambulatory Visit (INDEPENDENT_AMBULATORY_CARE_PROVIDER_SITE_OTHER): Payer: Medicare Other | Admitting: Orthopedic Surgery

## 2016-11-05 ENCOUNTER — Ambulatory Visit (INDEPENDENT_AMBULATORY_CARE_PROVIDER_SITE_OTHER): Payer: Medicare Other

## 2016-11-05 ENCOUNTER — Encounter (INDEPENDENT_AMBULATORY_CARE_PROVIDER_SITE_OTHER): Payer: Self-pay | Admitting: Orthopedic Surgery

## 2016-11-05 VITALS — Ht 68.0 in | Wt 140.0 lb

## 2016-11-05 DIAGNOSIS — M25552 Pain in left hip: Secondary | ICD-10-CM

## 2016-11-05 DIAGNOSIS — M5410 Radiculopathy, site unspecified: Secondary | ICD-10-CM | POA: Diagnosis not present

## 2016-11-05 MED ORDER — PREDNISONE 10 MG PO TABS: ORAL_TABLET | ORAL | 0 refills | Status: DC

## 2016-11-05 NOTE — Addendum Note (Signed)
Addended by: Aldean BakerUDA, MARCUS on: 11/05/2016 10:52 AM   Modules accepted: Orders
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
## 2016-11-10 ENCOUNTER — Other Ambulatory Visit (INDEPENDENT_AMBULATORY_CARE_PROVIDER_SITE_OTHER): Payer: Self-pay

## 2016-12-02 ENCOUNTER — Ambulatory Visit (INDEPENDENT_AMBULATORY_CARE_PROVIDER_SITE_OTHER): Payer: Medicare Other | Admitting: Family

## 2016-12-02 ENCOUNTER — Encounter (INDEPENDENT_AMBULATORY_CARE_PROVIDER_SITE_OTHER): Payer: Self-pay | Admitting: Orthopedic Surgery

## 2016-12-02 DIAGNOSIS — M5442 Lumbago with sciatica, left side: Secondary | ICD-10-CM | POA: Diagnosis not present

## 2016-12-02 MED ORDER — HYDROCODONE-ACETAMINOPHEN 10-325 MG PO TABS: 1.0000 | ORAL_TABLET | Freq: Four times a day (QID) | ORAL | 0 refills | Status: DC | PRN
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
## 2016-12-03 ENCOUNTER — Ambulatory Visit (INDEPENDENT_AMBULATORY_CARE_PROVIDER_SITE_OTHER): Payer: Medicare Other | Admitting: Orthopedic Surgery

## 2016-12-08 DIAGNOSIS — F5101 Primary insomnia: Secondary | ICD-10-CM | POA: Diagnosis not present

## 2016-12-08 DIAGNOSIS — R569 Unspecified convulsions: Secondary | ICD-10-CM | POA: Diagnosis not present

## 2016-12-08 DIAGNOSIS — F329 Major depressive disorder, single episode, unspecified: Secondary | ICD-10-CM | POA: Diagnosis not present

## 2016-12-08 DIAGNOSIS — M159 Polyosteoarthritis, unspecified: Secondary | ICD-10-CM | POA: Diagnosis not present

## 2016-12-08 DIAGNOSIS — Z681 Body mass index (BMI) 19 or less, adult: Secondary | ICD-10-CM | POA: Diagnosis not present

## 2016-12-08 DIAGNOSIS — I10 Essential (primary) hypertension: Secondary | ICD-10-CM | POA: Diagnosis not present

## 2016-12-08 DIAGNOSIS — Z23 Encounter for immunization: Secondary | ICD-10-CM | POA: Diagnosis not present

## 2016-12-11 ENCOUNTER — Ambulatory Visit (HOSPITAL_COMMUNITY): Payer: Medicare Other

## 2016-12-23 ENCOUNTER — Encounter (INDEPENDENT_AMBULATORY_CARE_PROVIDER_SITE_OTHER): Payer: Self-pay | Admitting: Orthopedic Surgery

## 2016-12-23 ENCOUNTER — Ambulatory Visit (INDEPENDENT_AMBULATORY_CARE_PROVIDER_SITE_OTHER): Payer: Medicare Other | Admitting: Family

## 2016-12-23 ENCOUNTER — Encounter (INDEPENDENT_AMBULATORY_CARE_PROVIDER_SITE_OTHER): Payer: Self-pay

## 2016-12-23 ENCOUNTER — Telehealth (INDEPENDENT_AMBULATORY_CARE_PROVIDER_SITE_OTHER): Payer: Self-pay | Admitting: Family

## 2016-12-23 VITALS — Ht 68.0 in | Wt 140.0 lb

## 2016-12-23 DIAGNOSIS — M5442 Lumbago with sciatica, left side: Secondary | ICD-10-CM

## 2016-12-23 MED ORDER — HYDROCODONE-ACETAMINOPHEN 5-325 MG PO TABS: 1.0000 | ORAL_TABLET | Freq: Four times a day (QID) | ORAL | 0 refills | Status: DC | PRN
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
## 2016-12-24 NOTE — Telephone Encounter (Signed)
I spoke with Denny Peonrin about this, she states is aware patient is on suboxone and [facility] pharmacy and they are going to fill for patient.

## 2017-01-03 ENCOUNTER — Ambulatory Visit (HOSPITAL_COMMUNITY)
Admission: RE | Admit: 2017-01-03 | Discharge: 2017-01-03 | Disposition: A | Discharge status: Home / Self Care | Payer: Medicare Other | Source: Ambulatory Visit | Attending: Family | Admitting: Family

## 2017-01-03 DIAGNOSIS — M47896 Other spondylosis, lumbar region: Secondary | ICD-10-CM | POA: Diagnosis not present

## 2017-01-03 DIAGNOSIS — M4807 Spinal stenosis, lumbosacral region: Secondary | ICD-10-CM | POA: Insufficient documentation

## 2017-01-03 DIAGNOSIS — M48061 Spinal stenosis, lumbar region without neurogenic claudication: Secondary | ICD-10-CM | POA: Diagnosis not present

## 2017-01-03 DIAGNOSIS — M5442 Lumbago with sciatica, left side: Secondary | ICD-10-CM | POA: Diagnosis not present

## 2017-01-03 DIAGNOSIS — M545 Low back pain: Secondary | ICD-10-CM | POA: Diagnosis not present

## 2017-01-04 ENCOUNTER — Other Ambulatory Visit (INDEPENDENT_AMBULATORY_CARE_PROVIDER_SITE_OTHER): Payer: Self-pay | Admitting: Family

## 2017-01-04 DIAGNOSIS — M48061 Spinal stenosis, lumbar region without neurogenic claudication: Secondary | ICD-10-CM

## 2017-01-07 DIAGNOSIS — F329 Major depressive disorder, single episode, unspecified: Secondary | ICD-10-CM | POA: Diagnosis not present

## 2017-01-07 DIAGNOSIS — I10 Essential (primary) hypertension: Secondary | ICD-10-CM | POA: Diagnosis not present

## 2017-01-07 DIAGNOSIS — M159 Polyosteoarthritis, unspecified: Secondary | ICD-10-CM | POA: Diagnosis not present

## 2017-01-07 DIAGNOSIS — F411 Generalized anxiety disorder: Secondary | ICD-10-CM | POA: Diagnosis not present

## 2017-01-07 DIAGNOSIS — F5101 Primary insomnia: Secondary | ICD-10-CM | POA: Diagnosis not present

## 2017-01-07 DIAGNOSIS — R569 Unspecified convulsions: Secondary | ICD-10-CM | POA: Diagnosis not present

## 2017-01-07 DIAGNOSIS — R748 Abnormal levels of other serum enzymes: Secondary | ICD-10-CM | POA: Diagnosis not present

## 2017-01-12 ENCOUNTER — Encounter (INDEPENDENT_AMBULATORY_CARE_PROVIDER_SITE_OTHER): Payer: Medicare Other | Admitting: Physical Medicine and Rehabilitation

## 2017-01-17 ENCOUNTER — Ambulatory Visit (INDEPENDENT_AMBULATORY_CARE_PROVIDER_SITE_OTHER): Payer: Medicare Other | Admitting: Physical Medicine and Rehabilitation

## 2017-01-17 ENCOUNTER — Encounter (INDEPENDENT_AMBULATORY_CARE_PROVIDER_SITE_OTHER): Payer: Self-pay | Admitting: Physical Medicine and Rehabilitation

## 2017-01-17 VITALS — BP 102/70 | HR 72 | Temp 98.2°F

## 2017-01-17 DIAGNOSIS — M5416 Radiculopathy, lumbar region: Secondary | ICD-10-CM

## 2017-01-17 MED ORDER — LIDOCAINE HCL (PF) 1 % IJ SOLN
0.3300 mL | Freq: Once | INTRAMUSCULAR | Status: AC
  Administered 2017-01-17: 0.3 mL

## 2017-01-17 MED ORDER — METHYLPREDNISOLONE ACETATE 80 MG/ML IJ SUSP
80.0000 mg | Freq: Once | INTRAMUSCULAR | Status: AC
  Administered 2017-01-17: 80 mg

## 2017-01-17 NOTE — Patient Instructions (Signed)
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
## 2017-01-17 NOTE — Procedures (Signed)
Lumbosacral Transforaminal Epidural Steroid Injection - Infraneural Approach with Fluoroscopic Guidance  Patient: [name]      Date of Birth: 08-16-1956 MRN: [id number] PCP: Simone CuriaLEE,KEUNG, MD      Visit Date: 01/17/2017   Universal Protocol:    Date/Time: 01/22/182:39 PM  Consent Given By: the patient  Position: PRONE   Additional Comments: Vital signs were monitored before and after the procedure. Patient was prepped and draped in the usual sterile fashion. The correct patient, procedure, and site was verified.   Injection Procedure Details:  Procedure Site One Meds Administered:  Meds ordered this encounter  Medications  . lidocaine (PF) (XYLOCAINE) 1 % injection 0.3 mL  . methylPREDNISolone acetate (DEPO-MEDROL) injection 80 mg      Laterality: Left  Location/Site:  L5-S1  Needle size: 22 G  Needle type: Spinal  Needle Placement: Transforaminal  Findings:  -Contrast Used: 1 mL iohexol 180 mg iodine/mL   -Comments: Excellent flow of contrast along the nerve and into the epidural space.  Procedure Details: After squaring off the end-plates of the desired vertebral level to get a true AP view, the C-arm was obliqued to the painful side so that the superior articulating process is positioned about 1/3 the length of the inferior endplate.  The needle was aimed toward the junction of the superior articular process and the transverse process of the inferior vertebrae. The needle's initial entry is in the lower third of the foramen through Kambin's triangle. The soft tissues overlying this target were infiltrated with 2-3 ml. of 1% Lidocaine without Epinephrine.  The spinal needle was then inserted and advanced toward the target using a "trajectory" view along the fluoroscope beam.  Under AP and lateral visualization, the needle was advanced so it did not puncture dura and did not traverse medially beyond the 6 o'clock position of the pedicle. Bi-planar projections were  used to confirm position. Aspiration was confirmed to be negative for CSF and/or blood. A 1-2 ml. volume of Isovue-250 was injected and flow of contrast was noted at each level. Radiographs were obtained for documentation purposes.   After attaining the desired flow of contrast documented above, a 0.5 to 1.0 ml test dose of 0.25% Marcaine was injected into each respective transforaminal space.  The patient was observed for 90 seconds post injection.  After no sensory deficits were reported, and normal lower extremity motor function was noted,   the above injectate was administered so that equal amounts of the injectate were placed at each foramen (level) into the transforaminal epidural space.   Additional Comments:  The patient tolerated the procedure well Dressing: Band-Aid    Post-procedure details: Patient was observed during the procedure. Post-procedure instructions were reviewed.  Patient left the clinic in stable condition.

## 2017-01-26 ENCOUNTER — Telehealth (INDEPENDENT_AMBULATORY_CARE_PROVIDER_SITE_OTHER): Payer: Self-pay | Admitting: Physical Medicine and Rehabilitation

## 2017-01-26 NOTE — Telephone Encounter (Signed)
Pt is not any better after inj he had, he said we told him to call back. Please advise  [phone]

## 2017-01-26 NOTE — Telephone Encounter (Signed)
If he is no better at all after the L5 transforaminal injection and then check with Dr. Lajoyce Cornersuda and/or Barnie DelErin Zamora to see if they want to refer him to Dr. Otelia SergeantNitka or other spine surgeon. He has foraminal stenosis at L5 and really nothing else that would cause that type of pain. The injection looks fairly well placed.

## 2017-01-26 NOTE — Telephone Encounter (Signed)
Called patient and state that Dr. Alvester MorinNewton feels like he had a good injection will have him evaluated by Dr. Otelia SergeantNitka For evaluation for surgical intervention.

## 2017-01-26 NOTE — Telephone Encounter (Signed)
Do you want to refer to Dr. Otelia SergeantNitka? Please see message below.

## 2017-01-27 ENCOUNTER — Telehealth (INDEPENDENT_AMBULATORY_CARE_PROVIDER_SITE_OTHER): Payer: Self-pay

## 2017-01-27 NOTE — Telephone Encounter (Signed)
Pt called and states that the injection did not help and he does not know what to do from here.

## 2017-02-02 ENCOUNTER — Ambulatory Visit (INDEPENDENT_AMBULATORY_CARE_PROVIDER_SITE_OTHER): Payer: Medicare Other | Admitting: Specialist

## 2017-02-02 ENCOUNTER — Encounter (INDEPENDENT_AMBULATORY_CARE_PROVIDER_SITE_OTHER): Payer: Self-pay | Admitting: Specialist

## 2017-02-02 VITALS — BP 113/70 | HR 72 | Ht 68.0 in | Wt 145.0 lb

## 2017-02-02 DIAGNOSIS — M5442 Lumbago with sciatica, left side: Secondary | ICD-10-CM | POA: Diagnosis not present

## 2017-02-02 DIAGNOSIS — M5136 Other intervertebral disc degeneration, lumbar region: Secondary | ICD-10-CM | POA: Diagnosis not present

## 2017-02-02 DIAGNOSIS — G40909 Epilepsy, unspecified, not intractable, without status epilepticus: Secondary | ICD-10-CM

## 2017-02-02 DIAGNOSIS — M48062 Spinal stenosis, lumbar region with neurogenic claudication: Secondary | ICD-10-CM

## 2017-02-02 DIAGNOSIS — S22000S Wedge compression fracture of unspecified thoracic vertebra, sequela: Secondary | ICD-10-CM

## 2017-02-02 MED ORDER — METHYLPREDNISOLONE 4 MG PO TBPK: ORAL_TABLET | ORAL | 0 refills | Status: DC

## 2017-02-02 NOTE — Progress Notes (Signed)
Office Visit Note   Patient: [name]           Date of Birth: [birth year]           MRN: [id number] Visit Date: 02/02/2017              Requested by: Simone CuriaKeung Lee, MD [address] [place], [place] [id number] PCP: Simone CuriaLEE,KEUNG, MD   Assessment & Plan: Visit Diagnoses:  1. Spinal stenosis of lumbar region with neurogenic claudication   2. Degenerative disc disease, lumbar   3. Thoracic compression fracture, sequela   4. Acute left-sided low back pain with left-sided sciatica     Plan:Avoid bending, stooping and avoid lifting weights greater than 10 lbs. Avoid prolong standing and walking. Avoid frequent bending and stooping  No lifting greater than 10 lbs. May use ice or moist heat for pain. Weight loss is of benefit.    Follow-Up Instructions: Return in about 4 weeks (around 03/02/2017) for Post operative followup.   Orders:  No orders of the defined types were placed in this encounter.  No orders of the defined types were placed in this encounter.     Procedures: No procedures performed   Clinical Data: Findings:  MRI from 12/2016 lumbar spine with DDD L2-3, L4-5 and L5-S1, subarticular stenosis left L4-5 with mild foramenal stenosis left L4 and severe at the left L5-S1 level. Plain radiographs show minimal compression deformity T12 DDD L2-3, L4-5 and L5-S1. No dislocation, mild left SI joint sclerosis and narrowing. Retrolisthesis L5-S1 grade 1, Anterior spur formation L5-S1 and L2-3.    Subjective: Chief Complaint  Patient presents with  . Lower Back - Pain  . Left Leg - Pain    [name] is here for low back pain that radiates down the left leg to his foot.  2 weeks ago he had a left L5-S1 Transforaminal injection with Dr. Alvester MorinNewton, he states that it did not help him at all.  States that his pain is in his low back and radiates into the left leg down to his foot.  He says that this has been going on 4-5 months. Pain in the left leg is left lateral  thigh into the left lateral calf and into the left big and 2nd toe. Pain worsening with standing and walking. Improves with stooping and leaning on carts when shopping, walking limited to about 2 yards, in the past does walk for exersice with his dog. Pain worsened post fall this past 5-6 months ago when he had a seizure onside of his car in his carport. [place] Has undergone a single ESI by Dr. Alvester MorinNewton with only 2-3 days of relief. Pain on a scale 1-10 is a 10, improves to about an "8". Has night pain as well. Weight loss with  suboxone over 6=8 months he takes for narcotic addiction treatment. Bowel and bladder function is normal. Tends to stoop with standing.    Review of Systems  Constitutional: Negative.   HENT: Negative.   Eyes: Negative.   Respiratory: Negative.   Cardiovascular: Negative.   Gastrointestinal: Negative.   Endocrine: Negative.   Genitourinary: Negative.   Musculoskeletal: Negative.   Skin: Negative.   Allergic/Immunologic: Negative.   Neurological: Negative.   Hematological: Negative.   Psychiatric/Behavioral: Negative.      Objective: Vital Signs: BP 113/70 (BP Location: Left Arm, Patient Position: Sitting)   Pulse 72   Ht 5\' 8"  (1.727 m)   Wt 145 lb (  65.8 kg)   BMI 22.05 kg/m   Physical Exam  Constitutional: He is oriented to person, place, and time. He appears well-developed and well-nourished.  HENT:  Head: Normocephalic and atraumatic.  Eyes: EOM are normal. Pupils are equal, round, and reactive to light.  Neck: Normal range of motion. Neck supple.  Pulmonary/Chest: Effort normal and breath sounds normal.  Abdominal: Soft. Bowel sounds are normal.  Neurological: He is alert and oriented to person, place, and time.  Skin: Skin is warm and dry.  Psychiatric: He has a normal mood and affect. His behavior is normal. Judgment and thought content normal.    Back Exam   Range of Motion  Extension:  10 abnormal  Flexion:  60 abnormal    Lateral Bend Right: abnormal  Lateral Bend Left: abnormal  Rotation Right: abnormal  Rotation Left: abnormal   Muscle Strength  Right Quadriceps:  5/5  Left Quadriceps:  5/5  Right Hamstrings:  5/5  Left Hamstrings:  5/5   Tests  Straight leg raise right: negative Straight leg raise left: negative  Reflexes  Patellar: normal Achilles: normal Babinski's sign: normal   Other  Toe Walk: normal Heel Walk: abnormal Sensation: decreased Gait: normal  Erythema: no back redness Scars: absent  Comments:  Weak left EHL 4/5 and left foot Dorsiflexion.      Specialty Comments:  No specialty comments available.  Imaging: No results found.   PMFS History: Patient Active Problem List   Diagnosis Date Noted  . Acute left-sided low back pain with left-sided sciatica 12/02/2016  . Avascular necrosis of hip (HCC) 02/05/2015   Past Medical History:  Diagnosis Date  . Depression   . Headache   . Hypertension   . Kidney stone   . Seizures (HCC)    last seizure about year ago (was driving at time of seizure)    No family history on file.  Past Surgical History:  Procedure Laterality Date  . JOINT REPLACEMENT Right    hip  . ROTATOR CUFF REPAIR Left   . TOTAL HIP ARTHROPLASTY Left 02/05/2015   Procedure: TOTAL HIP ARTHROPLASTY;  Surgeon: Nadara Mustard, MD;  Location: [facility] OR;  Service: Orthopedics;  Laterality: Left;   Social History   Occupational History  . Not on file.   Social History Main Topics  . Smoking status: Never Smoker  . Smokeless tobacco: Never Used  . Alcohol use 2.4 oz/week    4 Cans of beer per week  . Drug use: No  . Sexual activity: Not on file

## 2017-02-02 NOTE — Patient Instructions (Addendum)
Avoid bending, stooping and avoid lifting weights greater than 10 lbs. Avoid prolong standing and walking. Avoid frequent bending and stooping  No lifting greater than 10 lbs. May use ice or moist heat for pain. Weight loss is of benefit. Recent worsening of seizures, will request neurology eval before surgical treatment.

## 2017-02-07 DIAGNOSIS — R569 Unspecified convulsions: Secondary | ICD-10-CM | POA: Diagnosis not present

## 2017-02-07 DIAGNOSIS — Z681 Body mass index (BMI) 19 or less, adult: Secondary | ICD-10-CM | POA: Diagnosis not present

## 2017-02-07 DIAGNOSIS — F5101 Primary insomnia: Secondary | ICD-10-CM | POA: Diagnosis not present

## 2017-02-07 DIAGNOSIS — M159 Polyosteoarthritis, unspecified: Secondary | ICD-10-CM | POA: Diagnosis not present

## 2017-02-07 DIAGNOSIS — F411 Generalized anxiety disorder: Secondary | ICD-10-CM | POA: Diagnosis not present

## 2017-02-07 DIAGNOSIS — F329 Major depressive disorder, single episode, unspecified: Secondary | ICD-10-CM | POA: Diagnosis not present

## 2017-02-07 DIAGNOSIS — I10 Essential (primary) hypertension: Secondary | ICD-10-CM | POA: Diagnosis not present

## 2017-02-16 DIAGNOSIS — F329 Major depressive disorder, single episode, unspecified: Secondary | ICD-10-CM | POA: Diagnosis not present

## 2017-02-16 DIAGNOSIS — F5101 Primary insomnia: Secondary | ICD-10-CM | POA: Diagnosis not present

## 2017-02-16 DIAGNOSIS — F411 Generalized anxiety disorder: Secondary | ICD-10-CM | POA: Diagnosis not present

## 2017-02-16 DIAGNOSIS — Z01810 Encounter for preprocedural cardiovascular examination: Secondary | ICD-10-CM | POA: Diagnosis not present

## 2017-02-16 DIAGNOSIS — M159 Polyosteoarthritis, unspecified: Secondary | ICD-10-CM | POA: Diagnosis not present

## 2017-02-16 DIAGNOSIS — I10 Essential (primary) hypertension: Secondary | ICD-10-CM | POA: Diagnosis not present

## 2017-02-16 DIAGNOSIS — R569 Unspecified convulsions: Secondary | ICD-10-CM | POA: Diagnosis not present

## 2017-02-20 ENCOUNTER — Encounter (HOSPITAL_COMMUNITY): Payer: Self-pay | Admitting: Emergency Medicine

## 2017-02-20 ENCOUNTER — Emergency Department (HOSPITAL_COMMUNITY): Payer: Medicare Other

## 2017-02-20 ENCOUNTER — Emergency Department (HOSPITAL_COMMUNITY)
Admission: EM | Admit: 2017-02-20 | Discharge: 2017-02-20 | Disposition: A | Discharge status: Home / Self Care | Payer: Medicare Other | Attending: Emergency Medicine | Admitting: Emergency Medicine

## 2017-02-20 DIAGNOSIS — W1830XA Fall on same level, unspecified, initial encounter: Secondary | ICD-10-CM | POA: Insufficient documentation

## 2017-02-20 DIAGNOSIS — S80212A Abrasion, left knee, initial encounter: Secondary | ICD-10-CM | POA: Diagnosis not present

## 2017-02-20 DIAGNOSIS — Y999 Unspecified external cause status: Secondary | ICD-10-CM | POA: Insufficient documentation

## 2017-02-20 DIAGNOSIS — Y939 Activity, unspecified: Secondary | ICD-10-CM | POA: Insufficient documentation

## 2017-02-20 DIAGNOSIS — S8392XA Sprain of unspecified site of left knee, initial encounter: Secondary | ICD-10-CM

## 2017-02-20 DIAGNOSIS — M25562 Pain in left knee: Secondary | ICD-10-CM | POA: Diagnosis not present

## 2017-02-20 DIAGNOSIS — Y92002 Bathroom of unspecified non-institutional (private) residence single-family (private) house as the place of occurrence of the external cause: Secondary | ICD-10-CM | POA: Insufficient documentation

## 2017-02-20 DIAGNOSIS — Z23 Encounter for immunization: Secondary | ICD-10-CM | POA: Insufficient documentation

## 2017-02-20 DIAGNOSIS — Y929 Unspecified place or not applicable: Secondary | ICD-10-CM | POA: Insufficient documentation

## 2017-02-20 DIAGNOSIS — Z96643 Presence of artificial hip joint, bilateral: Secondary | ICD-10-CM | POA: Insufficient documentation

## 2017-02-20 DIAGNOSIS — S8992XA Unspecified injury of left lower leg, initial encounter: Secondary | ICD-10-CM | POA: Diagnosis present

## 2017-02-20 MED ORDER — IBUPROFEN 400 MG PO TABS
400.0000 mg | ORAL_TABLET | Freq: Once | ORAL | Status: AC
  Administered 2017-02-20: 400 mg via ORAL
  Filled 2017-02-20: qty 1

## 2017-02-20 MED ORDER — TETANUS-DIPHTH-ACELL PERTUSSIS 5-2.5-18.5 LF-MCG/0.5 IM SUSP
0.5000 mL | Freq: Once | INTRAMUSCULAR | Status: AC
  Administered 2017-02-20: 0.5 mL via INTRAMUSCULAR
  Filled 2017-02-20: qty 0.5

## 2017-02-20 NOTE — ED Provider Notes (Signed)
[facility]-EMERGENCY DEPT Provider Note   CSN: [id number] Arrival date & time: 02/20/17  1448  By signing my name below, I, Linna DarnerRussell Turner, attest that this documentation has been prepared under the direction and in the presence of physician practitioner, Cathren LaineKevin Caili Escalera, MD. Electronically Signed: Linna Darnerussell Turner, Scribe. 02/20/2017. 3:56 PM.  History   Chief Complaint Chief Complaint  Patient presents with  . Knee Pain    The history is provided by the patient. No language interpreter was used.     HPI Comments: [name] is a 61 y.o. male with PMHx including seizures who presents to the Emergency Department complaining of sudden onset, constant left knee pain beginning s/p seizure two nights ago. Pain is lateral, moderate, dull, worse w walking. He states he had a seizure in the bathroom and lost consciousness; he notes when he regained consciousness he was laying in his shower and had significant left knee pain. Pt notes an associated abrasion to his anterior left knee as well as bruising to his left hip. No h/o left knee problems. Tetanus status unknown. Pt denies joint swelling, numbness/tingling, or any other associated symptoms.  Past Medical History:  Diagnosis Date  . Depression   . Headache   . Hypertension   . Kidney stone   . Seizures (HCC)    last seizure about year ago (was driving at time of seizure)    Patient Active Problem List   Diagnosis Date Noted  . Acute left-sided low back pain with left-sided sciatica 12/02/2016  . Avascular necrosis of hip (HCC) 02/05/2015    Past Surgical History:  Procedure Laterality Date  . JOINT REPLACEMENT Right    hip  . ROTATOR CUFF REPAIR Left   . TOTAL HIP ARTHROPLASTY Left 02/05/2015   Procedure: TOTAL HIP ARTHROPLASTY;  Surgeon: Nadara MustardMarcus Duda V, MD;  Location: [facility] OR;  Service: Orthopedics;  Laterality: Left;       Home Medications    Prior to Admission medications   Medication Sig Start Date End Date Taking?  Authorizing Provider  amLODipine (NORVASC) 5 MG tablet Take 5 mg by mouth daily. 10/07/16   Historical Provider, MD  citalopram (CELEXA) 20 MG tablet Take 20 mg by mouth daily.    Historical Provider, MD  HYDROcodone-acetaminophen (NORCO/VICODIN) 5-325 MG tablet Take 1 tablet by mouth every 6 (six) hours as needed for moderate pain. 12/23/16   Adonis HugueninErin R Zamora, NP  ibuprofen (ADVIL,MOTRIN) 600 MG tablet Take 600 mg by mouth daily. 10/25/16   Historical Provider, MD  levETIRAcetam (KEPPRA) 750 MG tablet Take 750 mg by mouth 2 (two) times daily.    Historical Provider, MD  lisinopril (PRINIVIL,ZESTRIL) 40 MG tablet Take 40 mg by mouth daily.    Historical Provider, MD  methylPREDNISolone (MEDROL DOSEPAK) 4 MG TBPK tablet Take as directed. 02/02/17   Kerrin ChampagneJames E Nitka, MD  naproxen (NAPROSYN) 500 MG tablet Take 1 tablet (500 mg total) by mouth 2 (two) times daily. 09/17/16   Felicie Mornavid Smith, NP  pantoprazole (PROTONIX) 20 MG tablet Take 1 tablet (20 mg total) by mouth daily. 09/11/15   Tomasita CrumbleAdeleke Oni, MD  predniSONE (DELTASONE) 10 MG tablet 6,6,5,5,4,4,3,3,2,2,1,1, stop 11/05/16   Nadara MustardMarcus Duda V, MD  SUBOXONE 8-2 MG FILM Take 8 mg by mouth daily. 11/01/16   Historical Provider, MD    Family History History reviewed. No pertinent family history.  Social History Social History  Substance Use Topics  . Smoking status: Never Smoker  . Smokeless tobacco: Never Used  . Alcohol  use 2.4 oz/week    4 Cans of beer per week     Allergies   Patient has no known allergies.   Review of Systems Review of Systems  Musculoskeletal: Positive for arthralgias. Negative for joint swelling.  Skin: Positive for color change and wound.  Neurological: Positive for seizures. Negative for numbness.     Physical Exam Updated Vital Signs BP 118/92 (BP Location: Left Arm)   Pulse 77   Temp 98.6 F (37 C) (Oral)   Resp 16   Ht 5\' 8"  (1.727 m)   Wt 146 lb (66.2 kg)   SpO2 96%   BMI 22.20 kg/m   Physical Exam    Constitutional: He appears well-developed and well-nourished. No distress.  HENT:  Head: Normocephalic and atraumatic.  Eyes: Conjunctivae and EOM are normal.  Neck: Neck supple. No tracheal deviation present.  Cardiovascular: Normal rate.   Pulmonary/Chest: Effort normal. No respiratory distress.  Musculoskeletal: Normal range of motion. He exhibits tenderness.  Mild tenderness to the lateral left knee. Left knee is grossly stable. No effusion. Abrasion anteriorly without sign of infection. Distal pulses 2+.   Neurological: He is alert.  Ambulates w steady gait. Motor/sens LLE grossly intact.   Skin: Skin is warm and dry.  Psychiatric: He has a normal mood and affect. His behavior is normal.  Nursing note and vitals reviewed.    ED Treatments / Results  Labs (all labs ordered are listed, but only abnormal results are displayed) Labs Reviewed - No data to display  EKG  EKG Interpretation None       Radiology Dg Knee Complete 4 Views Left  Result Date: 02/20/2017 CLINICAL DATA:  Fall due to a seizure 2 nights ago with a left knee injury. Pain. Initial encounter. EXAM: LEFT KNEE - COMPLETE 4+ VIEW COMPARISON:  None. FINDINGS: No evidence of fracture, dislocation, or joint effusion. No evidence of arthropathy or other focal bone abnormality. Soft tissues demonstrate atherosclerosis. IMPRESSION: No acute abnormality. Atherosclerosis. Electronically Signed   By: Drusilla Kanner M.D.   On: 02/20/2017 15:36    Procedures Procedures (including critical care time)  DIAGNOSTIC STUDIES: Oxygen Saturation is 96% on RA, adequate by my interpretation.    COORDINATION OF CARE: 4:00 PM Discussed treatment plan with pt at bedside and pt agreed to plan.  Medications Ordered in ED Medications - No data to display   Initial Impression / Assessment and Plan / ED Course  I have reviewed the triage vital signs and the nursing notes.  Pertinent labs & imaging results that were available  during my care of the patient were reviewed by me and considered in my medical decision making (see chart for details).  Tetanus im.   Motrin po.  xrays neg. Discussed w pt.  Discussed possibility ligamentous, meniscal, and other soft tissue injury - will f/u ortho 2-3 weeks if knee pain or instability.   Final Clinical Impressions(s) / ED Diagnoses   Final diagnoses:  None    New Prescriptions New Prescriptions   No medications on file  I personally performed the services described in this documentation, which was scribed in my presence. The recorded information has been reviewed and considered. Cathren Laine, MD    Cathren Laine, MD 02/20/17 [phone]

## 2017-02-20 NOTE — ED Triage Notes (Signed)
Pt sts left knee pain after falling 4 days ago with seizure; pt has hx of same and is taking meds a prescribed

## 2017-02-20 NOTE — ED Notes (Signed)
Declined W/C at D/C and was escorted to lobby by RN. 

## 2017-02-20 NOTE — Discharge Instructions (Signed)
It was our pleasure to provide your ER care today - we hope that you feel better.  Take tylenol/advil as need.  As we discussed, your xrays show no bony injury, however, it is possible you have a soft tissue or ligamentous injury - follow up with orthopedist in the next couple weeks if symptoms fail to improve/resolve.  Return to ER if worse, new symptoms, recurrent seizures, severe pain, other concern.

## 2017-03-01 ENCOUNTER — Encounter: Payer: Self-pay | Admitting: Neurology

## 2017-03-01 ENCOUNTER — Ambulatory Visit (INDEPENDENT_AMBULATORY_CARE_PROVIDER_SITE_OTHER): Payer: Medicare Other | Admitting: Neurology

## 2017-03-01 VITALS — BP 125/77 | HR 93 | Resp 20 | Ht 68.0 in | Wt 149.0 lb

## 2017-03-01 DIAGNOSIS — G40909 Epilepsy, unspecified, not intractable, without status epilepticus: Secondary | ICD-10-CM | POA: Insufficient documentation

## 2017-03-01 DIAGNOSIS — M48062 Spinal stenosis, lumbar region with neurogenic claudication: Secondary | ICD-10-CM | POA: Insufficient documentation

## 2017-03-01 DIAGNOSIS — F3170 Bipolar disorder, currently in remission, most recent episode unspecified: Secondary | ICD-10-CM | POA: Insufficient documentation

## 2017-03-01 DIAGNOSIS — G2401 Drug induced subacute dyskinesia: Secondary | ICD-10-CM | POA: Diagnosis not present

## 2017-03-01 DIAGNOSIS — M5432 Sciatica, left side: Secondary | ICD-10-CM | POA: Diagnosis not present

## 2017-03-01 MED ORDER — LACOSAMIDE 100 MG PO TABS: ORAL_TABLET | ORAL | 3 refills | Status: DC

## 2017-03-01 NOTE — Patient Instructions (Signed)
  [place] DOES NOT ALLOW PATIENTS TO DRIVE FOR 6 MONTH AFTER A SEIZURE EVENT>  You are restricted for 6 month , until 08-22-2017  Epilepsy Epilepsy is when a person keeps having seizures. A seizure is unusual activity in the brain. A seizure can change how you think or behave, and it can make it hard to be aware of what is happening. This condition can cause problems, such as:  Falls, accidents, and injury.  Depression.  Poor memory.  Sudden unexplained death in epilepsy (SUDEP). This is rare. Its cause is not known. Most people with epilepsy lead normal lives. Follow these instructions at home: Medicines    Take medicines only as told by your doctor.  Avoid anything that may keep your medicine from working, such as alcohol. Activity   Get enough rest. Lack of sleep can make seizures more likely to occur.  Follow your doctor's advice about driving, swimming, and doing anything else that would be dangerous if you had a seizure. Teaching others  Teach friends and family what to do if you have a seizure. They should:  Lay you on the ground to prevent a fall.  Cushion your head and body.  Loosen any tight clothing around your neck.  Turn you on your side.  Stay with you until you are better.  Not hold you down.  Not put anything in your mouth.  Know whether or not you need emergency care. General instructions   Avoid anything that causes you to have seizures.  Keep a seizure diary. Write down what you remember about each seizure, and especially what might have caused it.  Keep all follow-up visits as told by your doctor. This is important. Contact a doctor if:  You have a change in your seizure pattern.  You get an infection or start to feel sick. You may have more seizures when you are sick. Get help right away if:  A seizure does not stop after 5 minutes.  You have more than one seizure in a row, and you do not have enough time between the seizures to  feel better.  A seizure makes it harder to breathe.  A seizure is different from other seizures you have had.  A seizure makes you unable to speak or use a part of your body.  You did not wake up right after a seizure. This information is not intended to replace advice given to you by your health care provider. Make sure you discuss any questions you have with your health care provider. Document Released: 10/10/2009 Document Revised: 07/19/2016 Document Reviewed: 06/22/2016 [employer] Interactive Patient Education  2017 [employer]
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
## 2017-03-02 ENCOUNTER — Telehealth: Payer: Self-pay | Admitting: Neurology

## 2017-03-02 DIAGNOSIS — Z79899 Other long term (current) drug therapy: Secondary | ICD-10-CM

## 2017-03-02 DIAGNOSIS — G40909 Epilepsy, unspecified, not intractable, without status epilepticus: Secondary | ICD-10-CM

## 2017-03-02 LAB — CBC WITH DIFFERENTIAL/PLATELET
Basophils Absolute: 0.1 10*3/uL (ref 0.0–0.2)
Basos: 1 %
EOS (ABSOLUTE): 0.2 10*3/uL (ref 0.0–0.4)
EOS: 3 %
HEMATOCRIT: 37.7 % (ref 37.5–51.0)
HEMOGLOBIN: 13 g/dL (ref 13.0–17.7)
Immature Grans (Abs): 0 10*3/uL (ref 0.0–0.1)
Immature Granulocytes: 0 %
LYMPHS ABS: 1.6 10*3/uL (ref 0.7–3.1)
Lymphs: 24 %
MCH: 32.3 pg (ref 26.6–33.0)
MCHC: 34.5 g/dL (ref 31.5–35.7)
MCV: 94 fL (ref 79–97)
MONOCYTES: 9 %
MONOS ABS: 0.6 10*3/uL (ref 0.1–0.9)
NEUTROS ABS: 4.2 10*3/uL (ref 1.4–7.0)
Neutrophils: 63 %
Platelets: 303 10*3/uL (ref 150–379)
RBC: 4.03 x10E6/uL — AB (ref 4.14–5.80)
RDW: 14.4 % (ref 12.3–15.4)
WBC: 6.7 10*3/uL (ref 3.4–10.8)

## 2017-03-02 LAB — COMPREHENSIVE METABOLIC PANEL
ALBUMIN: 4.6 g/dL (ref 3.5–5.5)
ALK PHOS: 100 IU/L (ref 39–117)
ALT: 38 IU/L (ref 0–44)
AST: 88 IU/L — ABNORMAL HIGH (ref 0–40)
Albumin/Globulin Ratio: 1.6 (ref 1.2–2.2)
BILIRUBIN TOTAL: 0.9 mg/dL (ref 0.0–1.2)
BUN / CREAT RATIO: 8 — AB (ref 9–20)
BUN: 7 mg/dL (ref 6–24)
CHLORIDE: 89 mmol/L — AB (ref 96–106)
CO2: 23 mmol/L (ref 18–29)
CREATININE: 0.92 mg/dL (ref 0.76–1.27)
Calcium: 9.7 mg/dL (ref 8.7–10.2)
GFR calc non Af Amer: 91 mL/min/{1.73_m2} (ref >59)
GFR, EST AFRICAN AMERICAN: 106 mL/min/{1.73_m2} (ref >59)
GLOBULIN, TOTAL: 2.9 g/dL (ref 1.5–4.5)
Glucose: 91 mg/dL (ref 65–99)
Potassium: 5.2 mmol/L (ref 3.5–5.2)
SODIUM: 131 mmol/L — AB (ref 134–144)
Total Protein: 7.5 g/dL (ref 6.0–8.5)

## 2017-03-02 NOTE — Telephone Encounter (Signed)
-----   Message from Melvyn Novasarmen Dohmeier, MD sent at 03/02/2017 12:06 PM EST ----- Hyponatremia, 131 mmol/l. Sharp rise in AST. Patient will need a sooner RV ( 1 month) with repeat transaminase testing. May see Np. CD

## 2017-03-02 NOTE — Telephone Encounter (Signed)
Pt returned RN's call °

## 2017-03-02 NOTE — Telephone Encounter (Signed)
I called pt to discuss. No answer, left a message asking him to call me back. 

## 2017-03-02 NOTE — Telephone Encounter (Signed)
I called pt. I advised him that his sodium level was low, but his AST, a liver enzyme, was sharply elevated. Pt already has an appt with Dr. Vickey Hugerohmeier on 03/31/2017, which is less than one month away. I advised him that Dr. Vickey Hugerohmeier will repeat this blood work to recheck his levels at this appt. I encouraged him to please keep this important appt. Pt verbalized understanding of results. Pt had no questions at this time but was encouraged to call back if questions arise.

## 2017-03-06 ENCOUNTER — Ambulatory Visit
Admission: RE | Admit: 2017-03-06 | Discharge: 2017-03-06 | Disposition: A | Discharge status: Home / Self Care | Payer: Medicare Other | Source: Ambulatory Visit | Attending: Neurology | Admitting: Neurology

## 2017-03-06 DIAGNOSIS — R569 Unspecified convulsions: Secondary | ICD-10-CM | POA: Diagnosis not present

## 2017-03-06 DIAGNOSIS — G40909 Epilepsy, unspecified, not intractable, without status epilepticus: Secondary | ICD-10-CM | POA: Diagnosis not present

## 2017-03-06 MED ORDER — GADOBENATE DIMEGLUMINE 529 MG/ML IV SOLN
14.0000 mL | Freq: Once | INTRAVENOUS | Status: AC | PRN
  Administered 2017-03-06: 14 mL via INTRAVENOUS

## 2017-03-07 ENCOUNTER — Ambulatory Visit (INDEPENDENT_AMBULATORY_CARE_PROVIDER_SITE_OTHER): Payer: Medicare Other | Admitting: Neurology

## 2017-03-07 DIAGNOSIS — G40909 Epilepsy, unspecified, not intractable, without status epilepticus: Secondary | ICD-10-CM | POA: Diagnosis not present

## 2017-03-08 ENCOUNTER — Telehealth: Payer: Self-pay

## 2017-03-08 DIAGNOSIS — R9089 Other abnormal findings on diagnostic imaging of central nervous system: Secondary | ICD-10-CM

## 2017-03-08 NOTE — Telephone Encounter (Signed)
I called pt to discuss his MRI results. No answer, left a message asking him to call me back. 

## 2017-03-08 NOTE — Telephone Encounter (Signed)
-----   Message from Melvyn Novasarmen Dohmeier, MD sent at 03/08/2017 10:16 AM EDT ----- This MRI of the brain with and without contrast shows the following: I quoted from Dr Bonnita HollowSater's report : 1.   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 This atrophy is advanced for age.  2.   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 I will consider an angiographic tudy to further differentiate this finding.  3.   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 ( coincidence- may cause headaches , not seizures )  4.    There are no acute findings.  CD

## 2017-03-10 ENCOUNTER — Telehealth: Payer: Self-pay | Admitting: *Deleted

## 2017-03-10 NOTE — Telephone Encounter (Signed)
LVM requesting call back re: MRI results. Left number.

## 2017-03-15 NOTE — Telephone Encounter (Signed)
Pt returned RN's call °

## 2017-03-15 NOTE — Telephone Encounter (Signed)
Called patient and he stated he spoke with Dr Dohmeier's nurse an hour ago. He stated she gave him his MRI results. He verbalized appreciation for call.

## 2017-03-15 NOTE — Telephone Encounter (Signed)
I called pt. I advised him that per Dr. Vickey Hugerohmeier, pt's MRI showed advanced for age mild atrophy in his brain. 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 Vickey Hugerohmeier has ordered a CT angiogram if the pt is agreeable to it, to further differentiate this finding. Dr. Vickey Hugerohmeier also found chronic left maxillary sinusitis which may cause headaches but not seizures. Otherwise, there are no acute findings. Pt says that he is agreeable to the CTA. Pt verbalized understanding of results. Pt had no questions at this time but was encouraged to call back if questions arise.  I spoke to HammondEmily, [facility], pt should call [phone] to schedule.  I called pt again to give him that number, but no answer, left a message asking him to call me back. If pt calls back, please ask him to call this number to get his CT angiogram scheduled.

## 2017-03-15 NOTE — Telephone Encounter (Signed)
I called pt. I gave him [facility]'s number and asked him to call and advise him that he needs to get scheduled for a CT Angiogram. Pt verbalized understanding and will call [facility]. He will call me back if he has any problems.

## 2017-03-16 ENCOUNTER — Encounter (INDEPENDENT_AMBULATORY_CARE_PROVIDER_SITE_OTHER): Payer: Self-pay | Admitting: Specialist

## 2017-03-16 ENCOUNTER — Ambulatory Visit (INDEPENDENT_AMBULATORY_CARE_PROVIDER_SITE_OTHER): Payer: Medicare Other | Admitting: Specialist

## 2017-03-16 VITALS — BP 127/75 | HR 80 | Ht 68.0 in | Wt 145.0 lb

## 2017-03-16 DIAGNOSIS — M48062 Spinal stenosis, lumbar region with neurogenic claudication: Secondary | ICD-10-CM | POA: Diagnosis not present

## 2017-03-16 DIAGNOSIS — M5137 Other intervertebral disc degeneration, lumbosacral region: Secondary | ICD-10-CM

## 2017-03-16 DIAGNOSIS — M51379 Other intervertebral disc degeneration, lumbosacral region without mention of lumbar back pain or lower extremity pain: Secondary | ICD-10-CM

## 2017-03-16 MED ORDER — HYDROCODONE-ACETAMINOPHEN 5-325 MG PO TABS: 1.0000 | ORAL_TABLET | Freq: Four times a day (QID) | ORAL | 0 refills | Status: DC | PRN
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
## 2017-03-16 NOTE — Patient Instructions (Signed)
Avoid bending, stooping and avoid lifting weights greater than 10 lbs. Avoid prolong standing and walking. Order for a new walker with wheels. Surgery scheduling secretary Sherri Billings, will call you in the next week to schedule for surgery.  Surgery recommended is a two level lumbar fusion L5-S1this would be done with rods, screws and cages with local bone graft and allograft (donor bone graft). Take hydrocodone for for pain. Risk of surgery includes risk of infection 1 in 200 patients, bleeding 1/2% chance you would need a transfusion.   Risk to the nerves is one in 10,000. You will need to use a brace for 3 months and wean from the brace on the 4th month. Expect improved walking and standing tolerance. Expect relief of leg pain but numbness may persist depending on the length and degree of pressure that has been present.   

## 2017-03-22 ENCOUNTER — Inpatient Hospital Stay: Admission: RE | Admit: 2017-03-22 | Payer: Medicare Other | Source: Ambulatory Visit

## 2017-03-24 ENCOUNTER — Ambulatory Visit
Admission: RE | Admit: 2017-03-24 | Discharge: 2017-03-24 | Disposition: A | Discharge status: Home / Self Care | Payer: Medicare Other | Source: Ambulatory Visit | Attending: Neurology | Admitting: Neurology

## 2017-03-24 DIAGNOSIS — R9089 Other abnormal findings on diagnostic imaging of central nervous system: Secondary | ICD-10-CM

## 2017-03-24 DIAGNOSIS — R569 Unspecified convulsions: Secondary | ICD-10-CM | POA: Diagnosis not present

## 2017-03-24 MED ORDER — IOPAMIDOL (ISOVUE-370) INJECTION 76%
75.0000 mL | Freq: Once | INTRAVENOUS | Status: AC | PRN
  Administered 2017-03-24: 75 mL via INTRAVENOUS

## 2017-03-24 NOTE — Procedures (Signed)
   [facility]  EEG (ELECTROENCEPHALOGRAM) REPORT   STUDY DATE: 03/07/17 PATIENT NAME: Jenean LindauRandall K Siever DOB: Apr 30, 1956 MRN: [id number]  ORDERING CLINICIAN: Melvyn Novasarmen Dohmeier, MD   TECHNOLOGIST: Judithann SheenLorrain Jones  TECHNIQUE: Electroencephalogram was recorded utilizing standard 10-20 system of lead placement and reformatted into average and bipolar montages.  RECORDING TIME: 30 miinutes ACTIVATION: hyperventilation and photic stimulation  CLINICAL INFORMATION: 61 year old male with seizure disorder.  FINDINGS: Background rhythms of 9-10 hertz and 15-20 microvolts. There is intermittent rhythmic delta activity, frontal predominant (FIRDA). No focal, lateralizing, epileptiform activity or seizures are seen. Patient recorded in the awake and drowsy state. EKG channel shows regular rhythm of 80 beats per minute.   IMPRESSION:  Mildly abnormal EEG in the awake and drowsy states demonstrating: 1. Occasional frontal intermittent rhythmic delta activity (FIRDA). This is a nonspecific finding but can be seen with a variety of toxic, metabolic or structural etiologies. 2. No epileptiform discharges or electrographic seizures are seen.     INTERPRETING PHYSICIAN:  Suanne MarkerVIKRAM R. Dedee Liss, MD Certified in Neurology, Neurophysiology and Neuroimaging  [facility] [address], [address] [place], [place] [address] [phone]

## 2017-03-29 ENCOUNTER — Telehealth: Payer: Self-pay | Admitting: Neurology

## 2017-03-29 NOTE — Telephone Encounter (Signed)
LM for patient with results below (per DPR). Advised that we will discuss further at appt on Thursday

## 2017-03-29 NOTE — Telephone Encounter (Signed)
Chart reviewed, patient was seen by Dr. Vickey Huger in March 2018 for seizure, chronic pain, medication treatment,  Please call patient, CT angiogram of the brain showed no significant abnormality. CT head was stable in compared to 2012,  IMPRESSION: 1. Negative intracranial CTA aside from mild vessel tortuosity. There is minimal ICA siphon calcified plaque with no stenosis. 2. 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 3. Stable CT appearance of the brain since 2012. No acute intracranial abnormality.

## 2017-03-31 ENCOUNTER — Encounter: Payer: Self-pay | Admitting: Neurology

## 2017-03-31 ENCOUNTER — Ambulatory Visit (INDEPENDENT_AMBULATORY_CARE_PROVIDER_SITE_OTHER): Payer: Medicare Other | Admitting: Neurology

## 2017-03-31 VITALS — BP 112/64 | HR 84 | Resp 16 | Ht 68.0 in | Wt 152.0 lb

## 2017-03-31 DIAGNOSIS — G40001 Localization-related (focal) (partial) idiopathic epilepsy and epileptic syndromes with seizures of localized onset, not intractable, with status epilepticus: Secondary | ICD-10-CM | POA: Diagnosis not present

## 2017-03-31 DIAGNOSIS — Z5181 Encounter for therapeutic drug level monitoring: Secondary | ICD-10-CM | POA: Diagnosis not present

## 2017-03-31 MED ORDER — LACOSAMIDE 150 MG PO TABS: 150.0000 mg | ORAL_TABLET | Freq: Two times a day (BID) | ORAL | 5 refills | Status: AC

## 2017-03-31 NOTE — Patient Instructions (Signed)
Copy to Dr Otelia Sergeant.  Cleared for surgery.
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
## 2017-04-01 ENCOUNTER — Telehealth: Payer: Self-pay | Admitting: *Deleted

## 2017-04-01 LAB — COMPREHENSIVE METABOLIC PANEL
ALT: 27 IU/L (ref 0–44)
AST: 31 IU/L (ref 0–40)
Albumin/Globulin Ratio: 1.5 (ref 1.2–2.2)
Albumin: 4.4 g/dL (ref 3.5–5.5)
Alkaline Phosphatase: 93 IU/L (ref 39–117)
BILIRUBIN TOTAL: 0.4 mg/dL (ref 0.0–1.2)
BUN/Creatinine Ratio: 10 (ref 9–20)
BUN: 8 mg/dL (ref 6–24)
CHLORIDE: 88 mmol/L — AB (ref 96–106)
CO2: 24 mmol/L (ref 18–29)
Calcium: 9.4 mg/dL (ref 8.7–10.2)
Creatinine, Ser: 0.77 mg/dL (ref 0.76–1.27)
GFR calc Af Amer: 116 mL/min/{1.73_m2} (ref >59)
GFR calc non Af Amer: 100 mL/min/{1.73_m2} (ref >59)
GLUCOSE: 107 mg/dL — AB (ref 65–99)
Globulin, Total: 2.9 g/dL (ref 1.5–4.5)
Potassium: 4.6 mmol/L (ref 3.5–5.2)
Sodium: 129 mmol/L — ABNORMAL LOW (ref 134–144)
Total Protein: 7.3 g/dL (ref 6.0–8.5)

## 2017-04-01 NOTE — Telephone Encounter (Signed)
Per Dr Dohmeier, LVM informing patient that his recent labs showed low sodium or hyponatremia. Advised he has had low sodium levels in the past, but this is the lowest yet. Advised his liver function tests and kidney levels are normal. Advised his Dr Vickey Huger stated he may have to reduce his fluid intake or increase his salt intake. Advised a copy will be faxed to his PCP, Dr Nedra Hai for information purposes. Left number and office hours for any questions.

## 2017-04-05 NOTE — Progress Notes (Signed)
Dr. Vickey Huger neurology has seen this patient and does not think his seizure disorder is a cause for delaying intervention for his lumbar condition. If there is no blue posting sheet let me know otherwise he is cleared for  Scheduling for surgery.

## 2017-04-25 ENCOUNTER — Ambulatory Visit (INDEPENDENT_AMBULATORY_CARE_PROVIDER_SITE_OTHER): Payer: Medicare Other | Admitting: Specialist

## 2017-04-25 ENCOUNTER — Encounter (INDEPENDENT_AMBULATORY_CARE_PROVIDER_SITE_OTHER): Payer: Self-pay | Admitting: Specialist

## 2017-04-25 VITALS — BP 104/68 | HR 76 | Ht 68.0 in | Wt 142.0 lb

## 2017-04-25 DIAGNOSIS — M5136 Other intervertebral disc degeneration, lumbar region: Secondary | ICD-10-CM | POA: Diagnosis not present

## 2017-04-25 DIAGNOSIS — M4316 Spondylolisthesis, lumbar region: Secondary | ICD-10-CM

## 2017-04-25 DIAGNOSIS — M5137 Other intervertebral disc degeneration, lumbosacral region: Secondary | ICD-10-CM | POA: Diagnosis not present

## 2017-04-25 DIAGNOSIS — M48062 Spinal stenosis, lumbar region with neurogenic claudication: Secondary | ICD-10-CM | POA: Diagnosis not present

## 2017-04-25 MED ORDER — HYDROCODONE-ACETAMINOPHEN 5-325 MG PO TABS: 1.0000 | ORAL_TABLET | Freq: Four times a day (QID) | ORAL | 0 refills | Status: DC | PRN
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
## 2017-04-25 NOTE — Patient Instructions (Signed)
Avoid bending, stooping and avoid lifting weights greater than 10 lbs. Avoid prolong standing and walking. Order for a new walker with wheels. Surgery scheduling secretary Sherri Billings, will call you in the next week to schedule for surgery.  Surgery recommended is a one level lumbar fusion L5-S1 this would be done with rods, screws and cages with local bone graft and allograft (donor bone graft). Take hydrocodone for for pain. Risk of surgery includes risk of infection 1 in 200 patients, bleeding 1/2% chance you would need a transfusion.   Risk to the nerves is one in 10,000. You will need to use a brace for 3 months and wean from the brace on the 4th month. Expect improved walking and standing tolerance. Expect relief of leg pain but numbness may persist depending on the length and degree of pressure that has been present.     

## 2017-05-03 DIAGNOSIS — M159 Polyosteoarthritis, unspecified: Secondary | ICD-10-CM | POA: Diagnosis not present

## 2017-05-03 DIAGNOSIS — R945 Abnormal results of liver function studies: Secondary | ICD-10-CM | POA: Diagnosis not present

## 2017-05-03 DIAGNOSIS — F411 Generalized anxiety disorder: Secondary | ICD-10-CM | POA: Diagnosis not present

## 2017-05-03 DIAGNOSIS — R569 Unspecified convulsions: Secondary | ICD-10-CM | POA: Diagnosis not present

## 2017-05-03 DIAGNOSIS — I10 Essential (primary) hypertension: Secondary | ICD-10-CM | POA: Diagnosis not present

## 2017-05-03 DIAGNOSIS — R748 Abnormal levels of other serum enzymes: Secondary | ICD-10-CM | POA: Diagnosis not present

## 2017-05-03 DIAGNOSIS — F5101 Primary insomnia: Secondary | ICD-10-CM | POA: Diagnosis not present

## 2017-05-03 DIAGNOSIS — F329 Major depressive disorder, single episode, unspecified: Secondary | ICD-10-CM | POA: Diagnosis not present

## 2017-05-05 ENCOUNTER — Encounter (HOSPITAL_COMMUNITY): Payer: Self-pay

## 2017-05-05 ENCOUNTER — Encounter (HOSPITAL_COMMUNITY)
Admission: RE | Admit: 2017-05-05 | Discharge: 2017-05-05 | Disposition: A | Discharge status: Home / Self Care | Payer: Medicare Other | Source: Ambulatory Visit | Attending: Specialist | Admitting: Specialist

## 2017-05-05 DIAGNOSIS — K219 Gastro-esophageal reflux disease without esophagitis: Secondary | ICD-10-CM | POA: Diagnosis not present

## 2017-05-05 DIAGNOSIS — G40909 Epilepsy, unspecified, not intractable, without status epilepticus: Secondary | ICD-10-CM | POA: Diagnosis not present

## 2017-05-05 DIAGNOSIS — Z01812 Encounter for preprocedural laboratory examination: Secondary | ICD-10-CM | POA: Insufficient documentation

## 2017-05-05 DIAGNOSIS — F3170 Bipolar disorder, currently in remission, most recent episode unspecified: Secondary | ICD-10-CM | POA: Diagnosis not present

## 2017-05-05 DIAGNOSIS — M5432 Sciatica, left side: Secondary | ICD-10-CM | POA: Insufficient documentation

## 2017-05-05 DIAGNOSIS — G2401 Drug induced subacute dyskinesia: Secondary | ICD-10-CM | POA: Insufficient documentation

## 2017-05-05 DIAGNOSIS — M5442 Lumbago with sciatica, left side: Secondary | ICD-10-CM | POA: Insufficient documentation

## 2017-05-05 DIAGNOSIS — I10 Essential (primary) hypertension: Secondary | ICD-10-CM | POA: Diagnosis not present

## 2017-05-05 DIAGNOSIS — M87059 Idiopathic aseptic necrosis of unspecified femur: Secondary | ICD-10-CM | POA: Insufficient documentation

## 2017-05-05 HISTORY — DX: Gastro-esophageal reflux disease without esophagitis: K21.9

## 2017-05-05 HISTORY — DX: Personal history of urinary calculi: Z87.442

## 2017-05-05 LAB — BASIC METABOLIC PANEL
Anion gap: 9 (ref 5–15)
CO2: 28 mmol/L (ref 22–32)
CREATININE: 0.82 mg/dL (ref 0.61–1.24)
Calcium: 8.7 mg/dL — ABNORMAL LOW (ref 8.9–10.3)
Chloride: 104 mmol/L (ref 101–111)
GFR calc Af Amer: >60 mL/min (ref >60)
Glucose, Bld: 96 mg/dL (ref 65–99)
Potassium: 3.9 mmol/L (ref 3.5–5.1)
SODIUM: 141 mmol/L (ref 135–145)

## 2017-05-05 LAB — CBC
HCT: 37.1 % — ABNORMAL LOW (ref 39.0–52.0)
HEMOGLOBIN: 12.5 g/dL — AB (ref 13.0–17.0)
MCH: 32.6 pg (ref 26.0–34.0)
MCHC: 33.7 g/dL (ref 30.0–36.0)
MCV: 96.6 fL (ref 78.0–100.0)
PLATELETS: 309 10*3/uL (ref 150–400)
RBC: 3.84 MIL/uL — ABNORMAL LOW (ref 4.22–5.81)
RDW: 13.7 % (ref 11.5–15.5)
WBC: 6.6 10*3/uL (ref 4.0–10.5)

## 2017-05-05 LAB — TYPE AND SCREEN
ABO/RH(D): A POS
Antibody Screen: NEGATIVE

## 2017-05-05 LAB — SURGICAL PCR SCREEN
MRSA, PCR: NEGATIVE
STAPHYLOCOCCUS AUREUS: POSITIVE — AB

## 2017-05-05 LAB — ABO/RH: ABO/RH(D): A POS

## 2017-05-05 NOTE — Progress Notes (Signed)
PCP  Lee,Keung  MD  Never seen cardiologist Denies any chest pain or discomfort Denies any cardiac testing

## 2017-05-05 NOTE — Pre-Procedure Instructions (Signed)
[name]  05/05/2017      [id number]- [place], [place] - [address] [place] [place] [id number] Phone: [phone] Fax: [phone]    Your procedure is scheduled on 05-09-2017  Monday .  [facility] at 5:30 A.M.   Call this number if you have problems the morning of surgery:  [phone]   Remember:  Do not eat food or drink liquids after midnight.   TAKE KEPPRA AT MIDNIGHT    Take these medicines the morning of surgery with A SIP OF WATER Amlodipine(Norvasc),cimetidine(Tagamet),citalopram(Celexa),pain medication if needed,Lacosamide,   HOLD SUBOXONE  FOR 3 DAYS PRIOR TO SURGERYSTOP ASPIRIN,ANTIINFLAMATORIES (IBUPROFEN,ALEVE,MOTRIN,ADVIL,GOODY'S POWDERS),HERBAL SUPPLEMENTS,FISH OIL,AND VITAMINS 5-7 DAYS PRIOR TO SURGERY   Do not wear jewelry, make-up or nail polish.  Do not wear lotions, powders, or perfumes, or deoderant.  Do not shave 48 hours prior to surgery.  Men may shave face and neck.  Do not bring valuables to the hospital.  [facility] is not responsible for any belongings or valuables.  Contacts, dentures or bridgework may not be worn into surgery.  Leave your suitcase in the car.  After surgery it may be brought to your room.  For patients admitted to the hospital, discharge time will be determined by your treatment team.  Patients discharged the day of surgery will not be allowed to drive home.    Special Instructions: [facility] - Preparing for Surgery  Before surgery, you can play an important role.  Because skin is not sterile, your skin needs to be as free of germs as possible.  You can reduce the number of germs on you skin by washing with CHG (chlorahexidine gluconate) soap before surgery.  CHG is an antiseptic cleaner which kills germs and bonds with the skin to continue killing germs even after washing.  Please DO NOT use if you have an allergy to CHG or antibacterial soaps.   If your skin becomes reddened/irritated stop using the CHG and inform your nurse when you arrive at Short Stay.  Do not shave (including legs and underarms) for at least 48 hours prior to the first CHG shower.  You may shave your face.  Please follow these instructions carefully:   1.  Shower with CHG Soap the night before surgery and the   morning of Surgery.  2.  If you choose to wash your hair, wash your hair first as usual with your normal shampoo.  3.  After you shampoo, rinse your hair and body thoroughly to remove the  Shampoo.  4.  Use CHG as you would any other liquid soap.  You can apply chg directly  to the skin and wash gently with scrungie or a clean washcloth.  5.  Apply the CHG Soap to your body ONLY FROM THE NECK DOWN.   Do not use on open wounds or open sores.  Avoid contact with your eyes,  ears, mouth and genitals (private parts).  Wash genitals (private parts) with your normal soap.  6.  Wash thoroughly, paying special attention to the area where your surgery will be performed.  7.  Thoroughly rinse your body with warm water from the neck down.  8.  DO NOT shower/wash with your normal soap after using and rinsing o  the CHG Soap.  9.  Pat yourself dry with a clean towel.            10.  Wear clean pajamas.  11.  Place clean sheets on your bed the night of your first shower and do not sleep with pets.  Day of Surgery  Do not apply any lotions/deodorants the morning of surgery.  Please wear clean clothes to the hospital/surgery center.   Please read over the following fact sheets that you were given. MRSA Information and Surgical Site Infection Prevention

## 2017-05-08 ENCOUNTER — Encounter (HOSPITAL_COMMUNITY): Payer: Self-pay | Admitting: Anesthesiology

## 2017-05-08 NOTE — Anesthesia Preprocedure Evaluation (Addendum)
Anesthesia Evaluation    Reviewed: Allergy & Precautions, Patient's Chart, lab work & pertinent test results  Airway Mallampati: I       Dental  (+) Edentulous Upper, Dental Advisory Given   Pulmonary neg pulmonary ROS,    breath sounds clear to auscultation       Cardiovascular hypertension, negative cardio ROS   Rhythm:Regular Rate:Normal     Neuro/Psych  Headaches, Seizures -,  PSYCHIATRIC DISORDERS Depression Bipolar Disorder  Neuromuscular disease    GI/Hepatic Neg liver ROS, GERD  ,  Endo/Other  negative endocrine ROS  Renal/GU negative Renal ROS  negative genitourinary   Musculoskeletal negative musculoskeletal ROS (+)   Abdominal   Peds negative pediatric ROS (+)  Hematology negative hematology ROS (+)   Anesthesia Other Findings Day of surgery medications reviewed with the patient.  Reproductive/Obstetrics negative OB ROS                            Anesthesia Physical Anesthesia Plan  ASA: II  Anesthesia Plan: General   Post-op Pain Management:    Induction: Intravenous  Airway Management Planned: Oral ETT  Additional Equipment:   Intra-op Plan:   Post-operative Plan: Extubation in OR  Informed Consent: I have reviewed the patients History and Physical, chart, labs and discussed the procedure including the risks, benefits and alternatives for the proposed anesthesia with the patient or authorized representative who has indicated his/her understanding and acceptance.   Dental advisory given  Plan Discussed with: CRNA  Anesthesia Plan Comments:         Anesthesia Quick Evaluation

## 2017-05-09 ENCOUNTER — Encounter (HOSPITAL_COMMUNITY): Payer: Self-pay | Admitting: Critical Care Medicine

## 2017-05-09 ENCOUNTER — Inpatient Hospital Stay (HOSPITAL_COMMUNITY)
Admission: RE | Admit: 2017-05-09 | Discharge: 2017-05-11 | DRG: 455 | Disposition: A | Discharge status: Home Health | Payer: Medicare Other | Source: Ambulatory Visit | Attending: Specialist | Admitting: Specialist

## 2017-05-09 ENCOUNTER — Inpatient Hospital Stay (HOSPITAL_COMMUNITY): Payer: Medicare Other

## 2017-05-09 ENCOUNTER — Inpatient Hospital Stay (HOSPITAL_COMMUNITY): Payer: Medicare Other | Admitting: Anesthesiology

## 2017-05-09 ENCOUNTER — Inpatient Hospital Stay (HOSPITAL_COMMUNITY)
Admission: RE | Disposition: A | Discharge status: Home Health | Payer: Self-pay | Source: Ambulatory Visit | Attending: Specialist

## 2017-05-09 DIAGNOSIS — M48061 Spinal stenosis, lumbar region without neurogenic claudication: Secondary | ICD-10-CM | POA: Diagnosis present

## 2017-05-09 DIAGNOSIS — K219 Gastro-esophageal reflux disease without esophagitis: Secondary | ICD-10-CM | POA: Diagnosis not present

## 2017-05-09 DIAGNOSIS — Z96642 Presence of left artificial hip joint: Secondary | ICD-10-CM | POA: Diagnosis present

## 2017-05-09 DIAGNOSIS — Z419 Encounter for procedure for purposes other than remedying health state, unspecified: Secondary | ICD-10-CM

## 2017-05-09 DIAGNOSIS — G40909 Epilepsy, unspecified, not intractable, without status epilepticus: Secondary | ICD-10-CM | POA: Diagnosis not present

## 2017-05-09 DIAGNOSIS — I10 Essential (primary) hypertension: Secondary | ICD-10-CM | POA: Diagnosis present

## 2017-05-09 DIAGNOSIS — M48062 Spinal stenosis, lumbar region with neurogenic claudication: Secondary | ICD-10-CM | POA: Diagnosis not present

## 2017-05-09 DIAGNOSIS — M4326 Fusion of spine, lumbar region: Secondary | ICD-10-CM | POA: Diagnosis not present

## 2017-05-09 DIAGNOSIS — F329 Major depressive disorder, single episode, unspecified: Secondary | ICD-10-CM | POA: Diagnosis not present

## 2017-05-09 DIAGNOSIS — Z79899 Other long term (current) drug therapy: Secondary | ICD-10-CM

## 2017-05-09 DIAGNOSIS — M545 Low back pain: Secondary | ICD-10-CM | POA: Diagnosis not present

## 2017-05-09 DIAGNOSIS — M5137 Other intervertebral disc degeneration, lumbosacral region: Secondary | ICD-10-CM | POA: Diagnosis present

## 2017-05-09 DIAGNOSIS — M4807 Spinal stenosis, lumbosacral region: Secondary | ICD-10-CM | POA: Diagnosis present

## 2017-05-09 HISTORY — PX: TRANSFORAMINAL LUMBAR INTERBODY FUSION (TLIF) WITH PEDICLE SCREW FIXATION 1 LEVEL: SHX6141

## 2017-05-09 LAB — PROTIME-INR
INR: 0.99
Prothrombin Time: 13.1 seconds (ref 11.4–15.2)

## 2017-05-09 LAB — APTT: aPTT: 28 seconds (ref 24–36)

## 2017-05-09 SURGERY — POSTERIOR LUMBAR FUSION 1 LEVEL
Anesthesia: General | Site: Spine Lumbar | Laterality: Left

## 2017-05-09 MED ORDER — ALUM & MAG HYDROXIDE-SIMETH 200-200-20 MG/5ML PO SUSP: 30.0000 mL | Freq: Four times a day (QID) | ORAL | Status: DC | PRN

## 2017-05-09 MED ORDER — SODIUM CHLORIDE 0.9% FLUSH: 3.0000 mL | INTRAVENOUS | Status: DC | PRN

## 2017-05-09 MED ORDER — ACETAMINOPHEN 325 MG PO TABS
650.0000 mg | ORAL_TABLET | ORAL | Status: DC | PRN
  Administered 2017-05-09 – 2017-05-11 (×2): 650 mg via ORAL
  Filled 2017-05-09 (×2): qty 2

## 2017-05-09 MED ORDER — BUPIVACAINE HCL 0.5 % IJ SOLN
INTRAMUSCULAR | Status: DC | PRN
  Administered 2017-05-09: 20 mL

## 2017-05-09 MED ORDER — HYDROMORPHONE HCL 1 MG/ML IJ SOLN
0.2500 mg | INTRAMUSCULAR | Status: DC | PRN
  Administered 2017-05-09 (×4): 0.5 mg via INTRAVENOUS

## 2017-05-09 MED ORDER — KETOROLAC TROMETHAMINE 15 MG/ML IJ SOLN
15.0000 mg | Freq: Four times a day (QID) | INTRAMUSCULAR | Status: AC
  Administered 2017-05-09 – 2017-05-10 (×3): 15 mg via INTRAVENOUS
  Filled 2017-05-09 (×3): qty 1

## 2017-05-09 MED ORDER — ROCURONIUM BROMIDE 10 MG/ML (PF) SYRINGE
PREFILLED_SYRINGE | INTRAVENOUS | Status: AC
  Filled 2017-05-09: qty 10

## 2017-05-09 MED ORDER — POLYETHYLENE GLYCOL 3350 17 G PO PACK: 17.0000 g | PACK | Freq: Every day | ORAL | Status: DC | PRN

## 2017-05-09 MED ORDER — PROPOFOL 10 MG/ML IV BOLUS
INTRAVENOUS | Status: DC | PRN
  Administered 2017-05-09: 150 mg via INTRAVENOUS

## 2017-05-09 MED ORDER — METHOCARBAMOL 1000 MG/10ML IJ SOLN
500.0000 mg | Freq: Four times a day (QID) | INTRAVENOUS | Status: DC | PRN
  Filled 2017-05-09: qty 5

## 2017-05-09 MED ORDER — MENTHOL 3 MG MT LOZG: 1.0000 | LOZENGE | OROMUCOSAL | Status: DC | PRN

## 2017-05-09 MED ORDER — OXYCODONE HCL ER 10 MG PO T12A
10.0000 mg | EXTENDED_RELEASE_TABLET | Freq: Two times a day (BID) | ORAL | Status: DC
  Administered 2017-05-09 – 2017-05-11 (×4): 10 mg via ORAL
  Filled 2017-05-09 (×4): qty 1

## 2017-05-09 MED ORDER — CEFAZOLIN SODIUM 1 G IJ SOLR
INTRAMUSCULAR | Status: AC
  Filled 2017-05-09: qty 20

## 2017-05-09 MED ORDER — BUPIVACAINE LIPOSOME 1.3 % IJ SUSP
20.0000 mL | INTRAMUSCULAR | Status: DC
  Filled 2017-05-09: qty 20

## 2017-05-09 MED ORDER — IBUPROFEN 200 MG PO TABS
600.0000 mg | ORAL_TABLET | Freq: Three times a day (TID) | ORAL | Status: DC | PRN
  Administered 2017-05-11: 600 mg via ORAL
  Filled 2017-05-09: qty 3

## 2017-05-09 MED ORDER — BUPIVACAINE LIPOSOME 1.3 % IJ SUSP
INTRAMUSCULAR | Status: DC | PRN
  Administered 2017-05-09: 20 mL

## 2017-05-09 MED ORDER — SURGIFOAM 100 EX MISC
CUTANEOUS | Status: DC | PRN
  Administered 2017-05-09: 1 via TOPICAL

## 2017-05-09 MED ORDER — CEFAZOLIN SODIUM 1 G IJ SOLR
INTRAMUSCULAR | Status: DC | PRN
  Administered 2017-05-09: 2 g via INTRAMUSCULAR

## 2017-05-09 MED ORDER — THROMBIN 20000 UNITS EX SOLR
CUTANEOUS | Status: AC
  Filled 2017-05-09: qty 20000

## 2017-05-09 MED ORDER — DEXAMETHASONE SODIUM PHOSPHATE 10 MG/ML IJ SOLN
INTRAMUSCULAR | Status: DC | PRN
  Administered 2017-05-09: 10 mg via INTRAVENOUS

## 2017-05-09 MED ORDER — ONDANSETRON HCL 4 MG/2ML IJ SOLN
INTRAMUSCULAR | Status: AC
  Filled 2017-05-09: qty 2

## 2017-05-09 MED ORDER — DOCUSATE SODIUM 100 MG PO CAPS
100.0000 mg | ORAL_CAPSULE | Freq: Two times a day (BID) | ORAL | Status: DC
  Administered 2017-05-09 – 2017-05-11 (×5): 100 mg via ORAL
  Filled 2017-05-09 (×5): qty 1

## 2017-05-09 MED ORDER — FENTANYL CITRATE (PF) 250 MCG/5ML IJ SOLN
INTRAMUSCULAR | Status: AC
  Filled 2017-05-09: qty 5

## 2017-05-09 MED ORDER — ONDANSETRON HCL 4 MG PO TABS: 4.0000 mg | ORAL_TABLET | Freq: Four times a day (QID) | ORAL | Status: DC | PRN

## 2017-05-09 MED ORDER — SODIUM CHLORIDE 0.9% FLUSH
3.0000 mL | Freq: Two times a day (BID) | INTRAVENOUS | Status: DC
  Administered 2017-05-09: 3 mL via INTRAVENOUS

## 2017-05-09 MED ORDER — PROPOFOL 10 MG/ML IV BOLUS
INTRAVENOUS | Status: AC
  Filled 2017-05-09: qty 20

## 2017-05-09 MED ORDER — CEFAZOLIN SODIUM-DEXTROSE 2-4 GM/100ML-% IV SOLN
INTRAVENOUS | Status: AC
  Filled 2017-05-09: qty 100

## 2017-05-09 MED ORDER — ONDANSETRON HCL 4 MG/2ML IJ SOLN
INTRAMUSCULAR | Status: DC | PRN
  Administered 2017-05-09: 4 mg via INTRAVENOUS

## 2017-05-09 MED ORDER — MEPERIDINE HCL 25 MG/ML IJ SOLN: 6.2500 mg | INTRAMUSCULAR | Status: DC | PRN

## 2017-05-09 MED ORDER — SODIUM CHLORIDE 0.9 % IV SOLN: 250.0000 mL | INTRAVENOUS | Status: DC

## 2017-05-09 MED ORDER — FAMOTIDINE 20 MG PO TABS
20.0000 mg | ORAL_TABLET | Freq: Every day | ORAL | Status: DC
  Administered 2017-05-09 – 2017-05-10 (×2): 20 mg via ORAL
  Filled 2017-05-09 (×2): qty 1

## 2017-05-09 MED ORDER — LISINOPRIL 40 MG PO TABS
40.0000 mg | ORAL_TABLET | Freq: Every day | ORAL | Status: DC
  Administered 2017-05-10 – 2017-05-11 (×2): 40 mg via ORAL
  Filled 2017-05-09 (×2): qty 1

## 2017-05-09 MED ORDER — SUGAMMADEX SODIUM 200 MG/2ML IV SOLN
INTRAVENOUS | Status: DC | PRN
  Administered 2017-05-09: 140 mg via INTRAVENOUS

## 2017-05-09 MED ORDER — GABAPENTIN 300 MG PO CAPS
300.0000 mg | ORAL_CAPSULE | Freq: Three times a day (TID) | ORAL | Status: DC
  Administered 2017-05-09 – 2017-05-11 (×6): 300 mg via ORAL
  Filled 2017-05-09 (×6): qty 1

## 2017-05-09 MED ORDER — METHOCARBAMOL 500 MG PO TABS
500.0000 mg | ORAL_TABLET | Freq: Four times a day (QID) | ORAL | Status: DC | PRN
  Administered 2017-05-09 – 2017-05-11 (×7): 500 mg via ORAL
  Filled 2017-05-09 (×7): qty 1

## 2017-05-09 MED ORDER — CITALOPRAM HYDROBROMIDE 20 MG PO TABS
20.0000 mg | ORAL_TABLET | Freq: Every day | ORAL | Status: DC
  Administered 2017-05-09 – 2017-05-11 (×3): 20 mg via ORAL
  Filled 2017-05-09 (×3): qty 1

## 2017-05-09 MED ORDER — MIDAZOLAM HCL 2 MG/2ML IJ SOLN
INTRAMUSCULAR | Status: AC
  Filled 2017-05-09: qty 2

## 2017-05-09 MED ORDER — LIDOCAINE 2% (20 MG/ML) 5 ML SYRINGE
INTRAMUSCULAR | Status: AC
  Filled 2017-05-09: qty 10

## 2017-05-09 MED ORDER — CEFAZOLIN SODIUM-DEXTROSE 2-4 GM/100ML-% IV SOLN
2.0000 g | INTRAVENOUS | Status: AC
  Administered 2017-05-09: 2 g via INTRAVENOUS

## 2017-05-09 MED ORDER — PHENYLEPHRINE 40 MCG/ML (10ML) SYRINGE FOR IV PUSH (FOR BLOOD PRESSURE SUPPORT)
PREFILLED_SYRINGE | INTRAVENOUS | Status: DC | PRN
  Administered 2017-05-09: 80 ug via INTRAVENOUS
  Administered 2017-05-09 (×2): 40 ug via INTRAVENOUS

## 2017-05-09 MED ORDER — LACOSAMIDE 50 MG PO TABS
150.0000 mg | ORAL_TABLET | Freq: Two times a day (BID) | ORAL | Status: DC
  Administered 2017-05-09 – 2017-05-11 (×5): 150 mg via ORAL
  Filled 2017-05-09 (×5): qty 3

## 2017-05-09 MED ORDER — CHLORHEXIDINE GLUCONATE 4 % EX LIQD: 60.0000 mL | Freq: Once | CUTANEOUS | Status: DC

## 2017-05-09 MED ORDER — LACTATED RINGERS IV SOLN
INTRAVENOUS | Status: DC | PRN
  Administered 2017-05-09 (×3): via INTRAVENOUS

## 2017-05-09 MED ORDER — LIDOCAINE 2% (20 MG/ML) 5 ML SYRINGE
INTRAMUSCULAR | Status: DC | PRN
  Administered 2017-05-09: 60 mg via INTRAVENOUS

## 2017-05-09 MED ORDER — BUPIVACAINE HCL (PF) 0.5 % IJ SOLN
INTRAMUSCULAR | Status: AC
  Filled 2017-05-09: qty 30

## 2017-05-09 MED ORDER — PROMETHAZINE HCL 25 MG/ML IJ SOLN: 6.2500 mg | INTRAMUSCULAR | Status: DC | PRN

## 2017-05-09 MED ORDER — SUGAMMADEX SODIUM 200 MG/2ML IV SOLN
INTRAVENOUS | Status: AC
  Filled 2017-05-09: qty 2

## 2017-05-09 MED ORDER — ARTIFICIAL TEARS OPHTHALMIC OINT
TOPICAL_OINTMENT | OPHTHALMIC | Status: AC
  Filled 2017-05-09: qty 3.5

## 2017-05-09 MED ORDER — BISACODYL 5 MG PO TBEC: 5.0000 mg | DELAYED_RELEASE_TABLET | Freq: Every day | ORAL | Status: DC | PRN

## 2017-05-09 MED ORDER — HEMOSTATIC AGENTS (NO CHARGE) OPTIME
TOPICAL | Status: DC | PRN
  Administered 2017-05-09: 1 via TOPICAL

## 2017-05-09 MED ORDER — SODIUM CHLORIDE 0.9 % IV SOLN: INTRAVENOUS | Status: DC

## 2017-05-09 MED ORDER — PHENYLEPHRINE HCL 10 MG/ML IJ SOLN
INTRAVENOUS | Status: DC | PRN
  Administered 2017-05-09: 20 ug/min via INTRAVENOUS

## 2017-05-09 MED ORDER — ROCURONIUM BROMIDE 10 MG/ML (PF) SYRINGE
PREFILLED_SYRINGE | INTRAVENOUS | Status: DC | PRN
  Administered 2017-05-09: 50 mg via INTRAVENOUS
  Administered 2017-05-09: 20 mg via INTRAVENOUS
  Administered 2017-05-09: 10 mg via INTRAVENOUS
  Administered 2017-05-09: 5 mg via INTRAVENOUS
  Administered 2017-05-09: 10 mg via INTRAVENOUS

## 2017-05-09 MED ORDER — LACOSAMIDE 50 MG PO TABS
150.0000 mg | ORAL_TABLET | Freq: Once | ORAL | Status: DC
  Filled 2017-05-09: qty 3

## 2017-05-09 MED ORDER — HYDROMORPHONE HCL 1 MG/ML IJ SOLN
INTRAMUSCULAR | Status: AC
  Filled 2017-05-09: qty 0.5

## 2017-05-09 MED ORDER — FLEET ENEMA 7-19 GM/118ML RE ENEM: 1.0000 | ENEMA | Freq: Once | RECTAL | Status: DC | PRN

## 2017-05-09 MED ORDER — PHENYLEPHRINE 40 MCG/ML (10ML) SYRINGE FOR IV PUSH (FOR BLOOD PRESSURE SUPPORT)
PREFILLED_SYRINGE | INTRAVENOUS | Status: AC
  Filled 2017-05-09: qty 20

## 2017-05-09 MED ORDER — THROMBIN 20000 UNITS EX SOLR
CUTANEOUS | Status: DC | PRN
  Administered 2017-05-09: 20000 [IU] via TOPICAL

## 2017-05-09 MED ORDER — LACTATED RINGERS IV SOLN: INTRAVENOUS | Status: DC

## 2017-05-09 MED ORDER — MIDAZOLAM HCL 5 MG/5ML IJ SOLN
INTRAMUSCULAR | Status: DC | PRN
  Administered 2017-05-09: 2 mg via INTRAVENOUS

## 2017-05-09 MED ORDER — AMLODIPINE BESYLATE 5 MG PO TABS
5.0000 mg | ORAL_TABLET | Freq: Every day | ORAL | Status: DC
  Administered 2017-05-10 – 2017-05-11 (×2): 5 mg via ORAL
  Filled 2017-05-09 (×2): qty 1

## 2017-05-09 MED ORDER — FENTANYL CITRATE (PF) 250 MCG/5ML IJ SOLN
INTRAMUSCULAR | Status: DC | PRN
  Administered 2017-05-09 (×10): 50 ug via INTRAVENOUS

## 2017-05-09 MED ORDER — ARTIFICIAL TEARS OPHTHALMIC OINT
TOPICAL_OINTMENT | OPHTHALMIC | Status: DC | PRN
Start: 2017-05-09 — End: 2017-05-09
  Administered 2017-05-09: 1 via OPHTHALMIC

## 2017-05-09 MED ORDER — HYDROMORPHONE HCL 1 MG/ML IJ SOLN
INTRAMUSCULAR | Status: AC
Start: 2017-05-09 — End: 2017-05-10
  Filled 2017-05-09: qty 0.5

## 2017-05-09 MED ORDER — 0.9 % SODIUM CHLORIDE (POUR BTL) OPTIME
TOPICAL | Status: DC | PRN
  Administered 2017-05-09: 2000 mL

## 2017-05-09 MED ORDER — MORPHINE SULFATE (PF) 4 MG/ML IV SOLN
1.0000 mg | INTRAVENOUS | Status: DC | PRN
  Administered 2017-05-09 – 2017-05-11 (×10): 1 mg via INTRAVENOUS
  Filled 2017-05-09 (×10): qty 1

## 2017-05-09 MED ORDER — OXYCODONE HCL 5 MG PO TABS
5.0000 mg | ORAL_TABLET | ORAL | Status: DC | PRN
  Administered 2017-05-09: 5 mg via ORAL
  Administered 2017-05-09 – 2017-05-11 (×9): 10 mg via ORAL
  Filled 2017-05-09: qty 1
  Filled 2017-05-09 (×10): qty 2

## 2017-05-09 MED ORDER — ONDANSETRON HCL 4 MG/2ML IJ SOLN: 4.0000 mg | Freq: Four times a day (QID) | INTRAMUSCULAR | Status: DC | PRN

## 2017-05-09 MED ORDER — CEFAZOLIN SODIUM-DEXTROSE 1-4 GM/50ML-% IV SOLN
1.0000 g | Freq: Three times a day (TID) | INTRAVENOUS | Status: AC
  Administered 2017-05-09 (×2): 1 g via INTRAVENOUS
  Filled 2017-05-09 (×3): qty 50

## 2017-05-09 MED ORDER — DEXAMETHASONE SODIUM PHOSPHATE 10 MG/ML IJ SOLN
INTRAMUSCULAR | Status: AC
  Filled 2017-05-09: qty 3

## 2017-05-09 MED ORDER — PHENOL 1.4 % MT LIQD: 1.0000 | OROMUCOSAL | Status: DC | PRN

## 2017-05-09 MED ORDER — LEVETIRACETAM 750 MG PO TABS
750.0000 mg | ORAL_TABLET | Freq: Two times a day (BID) | ORAL | Status: DC
  Administered 2017-05-09 – 2017-05-11 (×5): 750 mg via ORAL
  Filled 2017-05-09 (×5): qty 1

## 2017-05-09 MED ORDER — ACETAMINOPHEN 650 MG RE SUPP: 650.0000 mg | RECTAL | Status: DC | PRN

## 2017-05-09 MED FILL — Sodium Chloride IV Soln 0.9%: INTRAVENOUS | Qty: 2000 | Status: AC

## 2017-05-09 MED FILL — Heparin Sodium (Porcine) Inj 1000 Unit/ML: INTRAMUSCULAR | Qty: 30 | Status: AC

## 2017-05-09 SURGICAL SUPPLY — 73 items
BENZOIN TINCTURE PRP APPL 2/3 (GAUZE/BANDAGES/DRESSINGS) ×3 IMPLANT
BLADE CLIPPER SURG (BLADE) IMPLANT
BONE CANC CHIPS 20CC PCAN1/4 (Bone Implant) ×3 IMPLANT
BONE VIVIGEN FORMABLE 5.4CC (Bone Implant) ×3 IMPLANT
BUR MATCHSTICK NEURO 3.0 LAGG (BURR) ×3 IMPLANT
BUR RND FLUTED 2.5 (BURR) IMPLANT
BUR SABER RD CUTTING 3.0 (BURR) IMPLANT
BUR SABER RD CUTTING 3.0MM (BURR)
CHIPS CANC BONE 20CC PCAN1/4 (Bone Implant) ×1 IMPLANT
CLOSURE WOUND 1/2 X4 (GAUZE/BANDAGES/DRESSINGS) ×1
COVER BACK TABLE 80X110 HD (DRAPES) ×3 IMPLANT
COVER MAYO STAND STRL (DRAPES) ×3 IMPLANT
COVER SURGICAL LIGHT HANDLE (MISCELLANEOUS) ×3 IMPLANT
DECANTER SPIKE VIAL GLASS SM (MISCELLANEOUS) ×3 IMPLANT
DERMABOND ADVANCED (GAUZE/BANDAGES/DRESSINGS) ×2
DERMABOND ADVANCED .7 DNX12 (GAUZE/BANDAGES/DRESSINGS) ×1 IMPLANT
DRAPE C-ARM 42X72 X-RAY (DRAPES) ×3 IMPLANT
DRAPE C-ARMOR (DRAPES) ×3 IMPLANT
DRAPE POUCH INSTRU U-SHP 10X18 (DRAPES) ×3 IMPLANT
DRAPE SURG 17X23 STRL (DRAPES) ×12 IMPLANT
DRSG MEPILEX BORDER 4X4 (GAUZE/BANDAGES/DRESSINGS) IMPLANT
DRSG MEPILEX BORDER 4X8 (GAUZE/BANDAGES/DRESSINGS) ×3 IMPLANT
DURAPREP 26ML APPLICATOR (WOUND CARE) ×3 IMPLANT
DURASEAL SPINE SEALANT 3ML (MISCELLANEOUS) ×3 IMPLANT
ELECT BLADE 6.5 EXT (BLADE) IMPLANT
ELECT CAUTERY BLADE 6.4 (BLADE) ×3 IMPLANT
ELECT REM PT RETURN 9FT ADLT (ELECTROSURGICAL) ×3
ELECTRODE REM PT RTRN 9FT ADLT (ELECTROSURGICAL) ×1 IMPLANT
EVACUATOR 1/8 PVC DRAIN (DRAIN) IMPLANT
GAUZE SPONGE 4X4 12PLY STRL (GAUZE/BANDAGES/DRESSINGS) ×3 IMPLANT
GLOVE BIOGEL PI IND STRL 8 (GLOVE) ×1 IMPLANT
GLOVE BIOGEL PI INDICATOR 8 (GLOVE) ×2
GLOVE ECLIPSE 9.0 STRL (GLOVE) ×3 IMPLANT
GLOVE ORTHO TXT STRL SZ7.5 (GLOVE) ×3 IMPLANT
GLOVE SURG 8.5 LATEX PF (GLOVE) ×3 IMPLANT
GOWN STRL REUS W/ TWL LRG LVL3 (GOWN DISPOSABLE) ×1 IMPLANT
GOWN STRL REUS W/TWL 2XL LVL3 (GOWN DISPOSABLE) ×6 IMPLANT
GOWN STRL REUS W/TWL LRG LVL3 (GOWN DISPOSABLE) ×2
KIT BASIN OR (CUSTOM PROCEDURE TRAY) ×3 IMPLANT
KIT POSITION SURG JACKSON T1 (MISCELLANEOUS) ×3 IMPLANT
KIT ROOM TURNOVER OR (KITS) ×3 IMPLANT
NEEDLE 22X1 1/2 (OR ONLY) (NEEDLE) ×3 IMPLANT
NEEDLE ASP BONE MRW 11GX15 (NEEDLE) IMPLANT
NEEDLE BONE MARROW 8GAX6 (NEEDLE) IMPLANT
NEEDLE SPNL 18GX3.5 QUINCKE PK (NEEDLE) ×3 IMPLANT
NS IRRIG 1000ML POUR BTL (IV SOLUTION) ×3 IMPLANT
PACK LAMINECTOMY ORTHO (CUSTOM PROCEDURE TRAY) ×3 IMPLANT
PAD ARMBOARD 7.5X6 YLW CONV (MISCELLANEOUS) ×6 IMPLANT
PATTIES SURGICAL .75X.75 (GAUZE/BANDAGES/DRESSINGS) ×3 IMPLANT
PATTIES SURGICAL 1X1 (DISPOSABLE) ×3 IMPLANT
ROD PRE BENT EXP 40MM (Rod) ×6 IMPLANT
ROD PRE BENT EXPEDIUM 35MM (Rod) ×3 IMPLANT
SCREW CORT FIX FEN 5.5X7X35MM (Screw) ×6 IMPLANT
SCREW CORT FIX FEN 5.5X7X40MM (Screw) ×4 IMPLANT
SCREW SET SINGLE INNER (Screw) ×12 IMPLANT
SPACER TPAL 10MMX28MMX8MM (Spacer) ×3 IMPLANT
SPONGE SURGIFOAM ABS GEL 100 (HEMOSTASIS) IMPLANT
STRIP CLOSURE SKIN 1/2X4 (GAUZE/BANDAGES/DRESSINGS) ×2 IMPLANT
SUT VIC AB 0 CT1 27 (SUTURE) ×2
SUT VIC AB 0 CT1 27XBRD ANBCTR (SUTURE) ×1 IMPLANT
SUT VIC AB 1 CTX 36 (SUTURE) ×4
SUT VIC AB 1 CTX36XBRD ANBCTR (SUTURE) ×2 IMPLANT
SUT VIC AB 2-0 CT1 27 (SUTURE) ×2
SUT VIC AB 2-0 CT1 TAPERPNT 27 (SUTURE) ×1 IMPLANT
SUT VIC AB 3-0 X1 27 (SUTURE) ×3 IMPLANT
SYR 20CC LL (SYRINGE) ×3 IMPLANT
SYR CONTROL 10ML LL (SYRINGE) ×6 IMPLANT
TAP CANN VIPER2 DL 7.0 (TAP) ×6 IMPLANT
TOWEL OR 17X24 6PK STRL BLUE (TOWEL DISPOSABLE) ×3 IMPLANT
TOWEL OR 17X26 10 PK STRL BLUE (TOWEL DISPOSABLE) ×3 IMPLANT
TRAY FOLEY W/METER SILVER 16FR (SET/KITS/TRAYS/PACK) ×3 IMPLANT
WATER STERILE IRR 1000ML POUR (IV SOLUTION) ×3 IMPLANT
YANKAUER SUCT BULB TIP NO VENT (SUCTIONS) ×3 IMPLANT

## 2017-05-09 NOTE — Discharge Instructions (Signed)

## 2017-05-09 NOTE — Progress Notes (Signed)
Pt. Reports that his PCP told him to hold his Amlodipine for a few days because his BP has been low.

## 2017-05-09 NOTE — Care Management Note (Signed)
Case Management Note  Patient Details  Name: [name] MRN: [id number] Date of Birth: 08/24/1956  Subjective/Objective:                 Patient admitted with back surgery. He states his anticipated DC is 2-3 days. [facility] and [facility]. Patient has R[facility]. [facility] He will be staying with his daughter at [address] [place] [id number] for 3-4 days following surgery.   Action/Plan:   Expected Discharge Date:                  Expected Discharge Plan:  Home w Home Health Services  In-House Referral:     Discharge planning Services  CM Consult  Post Acute Care Choice:  Home Health, Durable Medical Equipment Choice offered to:  Patient  DME Arranged:  3-N-1 DME Agency:  [facility]  HH Arranged:  [facility]  [facility]  Status of Service:  Completed, signed off  If discussed at Long Length of Stay Meetings, dates discussed:    Additional Comments:  Lawerance SabalDebbie Deantre Bourdon, RN 05/09/2017, 3:22 PM

## 2017-05-09 NOTE — Op Note (Signed)
05/09/2017  11:48 AM  PATIENT:  [name]  61 y.o. 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 Dr. Suella Broad.     COMPLICATIONS:  None.     COMPONENTS:  Implant Name Type Inv. Item Serial No. Manufacturer Lot No. LRB No. Used  BONE CANC CHIPS 20CC - [id number] Bone Implant BONE [facility] CHIPS 20CC [id number] [facility] [place] [facility]  N/A 1  BONE VIVIGEN FORMABLE 5.4CC - [id number] Bone Implant BONE VIVIGEN FORMABLE 5.4CC [id number] [facility] [place] [facility]  N/A 1  T-Pal ProTi 360   N/A [employer] [facility] [facility] [id number] N/A 1  SCREW CORT FIX FEN 5.5X7X35MM - SN/A Screw SCREW CORT FIX FEN 5.5X7X35MM N/A [employer] N/A Left 2  SCREW CORT FIX FEN 5.5X7X40MM - SN/A Screw SCREW CORT FIX FEN 5.5X7X40MM N/A [employer] N/A Left 2  SCREW SET SINGLE INNER - SN/A Screw SCREW SET SINGLE INNER N/A [employer] N/A Left 4  ROD PRE BENT EXP 40MM - SN/A Rod ROD PRE BENT EXP 40MM N/A [employer] N/A Left 1  ROD PRE BENT EXPEDIUM 35MM - SN/A Rod ROD PRE BENT EXPEDIUM 35MM N/A [employer] N/A Left 1  ROD PRE BENT EXP 40MM - SN/A Rod ROD PRE BENT EXP 40MM N/A [employer] N/A Left 1     PROCEDURE: The patient was met in the holding area, and the appropriate lumbar levels left  L5-S1  identified and marked with an "X" and my initials. I had discussion with the patient in the preop holding area regarding a change of consent form.The fusion level was reidentified as  L5-S1. Patient understands the rationale to perform TLIF at L5-S1 level to decompress the bilateral L5-S1 lateral recess and foramenal stenosis. The patient was then transported to OR and was placed under general anestheticwithout difficulty. The patient received appropriate preoperative antibiotic prophylaxis ancef IV 2 grams.  Nursing staff inserted a Foley catheter under sterile conditions. The patient was then turned to a prone position using the Onycha spine frame. PAS. all pressure points well padded the arms at the side to 90 90. Standard prep with DuraPrep solution draped in the usual manner from the lower dorsal spine the mid sacral segment. Iodine Vi-Drape was used and the old incision scar was marked. Time-out procedure was called and correct. Skin in the midline between L2 and S1 was then infiltrated with local anesthesia, marcaine 1/2% 1:1 exparel 1.3% total 20 cc used. Incision was then made  extending from L3-S1  through the skin and subcutaneous layers down to the patient's lumbodorsal fascia and spinous processes. The incision then carried sharply excising the supraspinous ligament and then continuing the lateral aspect of the spinous processes of L4, L5 and S1. Cobb elevator used to carefully elevate the paralumbar muscles off of the posterior elements using electrocautery carefully drilled bleeding and perform  dissection of the muscle tissues of the preserving the facet capsule at the L4-5. Continuing the exposure out laterally to expose the lateral margin of the facet joint line at L4-5 and L5-S1. Incision was carried in the midline down to the L5-S1 level area bleeders controlled using electrocautery monopolar electrocautery.   C-arm fluoroscopy was then brought into the field and using C-arm fluoroscopy then  a hole made into the medial aspect of the pedicle of L5 using a high speed burr observed in the pedicle using C arm at the 5 oclock position on the left L5 pedicle nerve probe initial entry was determined on fluoroscopy to be good position alignment so that a 4.44m tap was passed to 40 mm within the left L5 pedicle to a depth of nearly 40 mm observed on C-arm fluoroscopy to be beyond the midpoint of the lumbar vertebra and then position alignment within the left L5 pedicle this was then removed and the pedicle channel probed demonstrating patency no sign of rupture the cortex of the pedicle. Tapping with a 4 mm screw tap then 5 mm tap then a 6 mm tap and a 7.043mtap, a 7.0 mm x 40 mm screw was reserved for later placement on the left side pedicle at the L5 level. C-arm fluoroscopy was then brought into the field and using C-arm fluoroscopy then a hole made into the posterior medial aspect of the pedicle of right L5 observed in the pedicle using ball tipped nerve hook and hockey stick nerve probe initial entry was determined on fluoroscopy to be good position alignment so that 4.52m9map was then used to tap the right L5 pedicle to a depth of nearly 40 mm observed on C-arm fluoroscopy to be beyond the midpoint of the lumbar vertebra and then position alignment within the right L5 pedicle this was then removed and the pedicle channel probed demonstrating patency no sign of rupture the cortex of the pedicle. Tapping with a 5 mm screw tap then tapping with a 6.0 mm tap, then a 7.52mm19mp,  a 7.0 mm x 40 mm screw was then placed on the right at the L5 level. C-arm fluoroscopy was then brought into the field and using C-arm fluoroscopy then a hole made into the posterior and medial aspect of the left pedicle of S1 observed in the pedicle using ball tipped nerve hook and hockey stick nerve probe initial entry was determined on fluoroscopy to be good position alignment so that a 4.0 mm tap was then used to tap the left S1  pedicle to a depth of nearly 35 mm observed on C-arm fluoroscopy to be beyond the posterior one third of the lumbar vertebra and good position alignment within the left S1 pedicle this was then removed and the pedicle channel probed demonstrating patency no sign of rupture the cortex of the pedicle. Tapping with a 4.0 mm screw tap then a 5.0 mm tap then tapping with a 6.0 mm tap and a 7.0 mm tap,  a 7.52mm 42m5 mm screw was saved for later placement on the left side at the S1 level. The pedicle channel of S1 on the left probed demonstrating patency no sign of rupture the cortex of the pedicle.C-arm fluoroscopy was then brought into the field and using C-arm fluoroscopy then a hole made into the posterior and medial aspect of the right pedicle of S1 observed in the pedicle using ball tipped nerve hook and hockey stick nerve probe initial entry was determined on fluoroscopy  to be good position alignment so that a 4.61m tap was then used to tap the right S1 pedicle to a depth of nearly 35 mm observed on C-arm fluoroscopy to be beyond the posterior one third of the lumbar vertebra and good position alignment within the right S1 pedicle this was then removed and the pedicle channel probed demonstrating patency no sign of rupture the cortex of the pedicle. Tapping with a 5 mm screwtap the a 6 mm tap and the a 7.0 mm tap, review of the C arm imaging suggested that the tap was more medial than expected so that a right sided laminectomy at L5-S1 was carried out and the medial border of the right S1 pedicle identified. The pedicle screw opening was medial to the expected pedicle but the S1 nerve root was intact coursing inferior and medial to the opening placed. The right lateral recess of L5-S1 was then exposed and the medial border of the S1 pedicle identified. Then using the medial border as the landmark for the inferomedial entry point for the pedicle screw a burr then used to drill the opening for the right S1 using C-arm  fluoroscopy then a hole made into the posterior and medial aspect of the right pedicle of S1 observed in the pedicle using ball tipped nerve hook and hockey stick nerve probe initial entry was determined on fluoroscopy to be good position alignment so that a 4.049mtap was then used to tap the right S1 pedicle to a depth of nearly 35 mm observed on C-arm fluoroscopy to be beyond the posterior one third of the lumbar vertebra and good position alignment within the right S1 pedicle this was then removed and the pedicle channel probed demonstrating patency no sign of rupture the cortex of the pedicle. Tapping with a 5 mm screw tap then a 6 mm tap and then a 7.0 mm tap, a 7.53m75m 35 mm screw was placed on the right side at the S1 level. The pedicle channel of S1 on the right probed demonstrating patency no sign of rupture the cortex of the pedicle. Viper screw for fixation of this level was measured as 7.0 mm x 35 mm screw reserved for insertion after decompression and TLIF.  Flow Seal was used for hemostasis of the left L5 and S1 pedicle screw holes.  Leksell rongeur used to resect inferior aspect of the lamina on the left side at the L5 level and partially on the right side at L5 The left medial 40% of the facets of L5-S1 were resected in order to decompress the left and right side of the lumbar thecal sac at L5-S1 and decompress the bilateral  L5 and S1 neuroforamen. Osteotomes and 2mm46md 3mm 61mrisons were used for this portion of the decompression. The left side decompression was carried out but with near complete facetectomy was perform on the left at L5-S1 to provide for exposure of the left side L5-S1 neuroforamen for ease of placement of TLIF (transforaminal lumbar interbody fusion) at the L5 level inferior portions of the lamina and pars were also resected first beginning with the Leksell rongeur and osteotomes and then resecting using 2 and 3 mm Kerrison. Continued laminectomy was carried out resecting the  central portions of the lamina of L5 and upper S1 performing foraminotomies on the left side at the L5 and S1 levels. The inferior articular process  L5 was resected on the left side.  A large amount of hypertrophic ligmentum flavum was found impressing on the  left lateral recesses at L5-S1 and narrowing the respective L5 and S1 neuroforamen.  Loupe magnification and headlight were used intially during this portion procedureThe the OR microscope was sterilely draped and brought into the field.  Attention then turned to placement of the transforaminal lumbar interbody fusion cages.  Bleeding controlled using bipolar electrocautery thrombin soaked gel cottonoids. Then turned to the left L5-S1 level the exposure the posterior lateral aspect this was carried out using a Penfield 4 bipolar electrocautery to control small bleeders present. Derricho retractor used to retract the thecal sac and L5 nerve root a 15 blade scalpel was used to incise posterior lateral aspect of the left L5-S1 disc the disc space at this level showed a rather severe narrowing posteriorly was more open anteriorly so that an osteotome again was used to resect a small portion the posterior inferior lip of the vertebral body at L5 and superior lip of S1 in order to gain ease of access into the L5-S1 disc space. The disc space then elevated with the dilators to 10 mm.  The space was debrided of degenerative disc material using serrated currettes and then pituitary ronguers the entire disc space was then debrided of degenerative disc material using the currettes and pituitary rongeurs curettage down to bleeding bone endplates. 22m, 8 mm and 9 mm shavers were used to debride the disc space and pituitary ronguers used to remove the loosened debris. This space was then carefully assess using spacers  a 9.011mtrial cage provided the best fit, the Depuy T-Pal ProTi Cage lordotic cage 25m52m 28 mm was chosen so that the permanent 9.0 mm cage by 28 mm Lordotic  TPAL ProTi cage packed with local bone graft and vivigen II was placed into the intervertebral disc space. The posterior intervertebral disc space was then packed with autogenous local bone graft that been harvested from the central laminectomy and conform DBX demineralized bone graft, allograft. Bleeding controlled using bipolar electrocautery.  Observed on C-arm fluoroscopy to be in good position alignment. The cage at L5-S1 was placed anteriorly as best as possible the correct patient's lordosis. With this then the transforaminal lumbar interbody fusion portion of the case was completed bleeders were controlled using bipolar electrocautery thrombin-soaked Gelfoam were appropriate.Decortication of the facet joints carried out right L5-S1. These were packed with cancellous local bone graft.  The 2 viper corticofixation screws on the right were each placed and then each fastener carefully aligned  to allow for placement of rods. The right side first quarter inch titanium rod was then carefully contoured using the french benders and a precontoured 40 mm rod. This was then placed into the pedicle screws on the right extending from L5-S1 each of the caps were carefully placed loosely tightened. Attention turned to the left side were similarly and then screws were carefully adjusted to allow for a better pattern screws to allow for placement of fixation of the rod a quarter inch 40 mm precontoured titanium rod was then carefully contoured. This was able to be inserted into the left pedicle screw fasteners, Caps onto the L5 fasteners were tightened to 80 foot lbs. Across the right side  L5-S1 screw fasteners compression was obtained on the right side between L5 and S1 compressing between the fasteners and tightening the screw caps 85 pounds. Similarly this was done on the left side at L5-S1 obtaining compression and tightened 85 pounds. Irrigation was carried out with copious amounts of saline solution this was done  throughout the case.The right  S1 nerve evaluated under the OR microscope and it showed an area of dural abrasion and a small area of tear, no neural element injury noted. Valsalva to 30 mm Hg showed no CSF leak. Duraseal was then introduced into the right lateral recess along the lateral aspect of the right S1 root and over the posterior right lateral recess.  Cell Saver was used during the case with 0 cc returned.  Hockey stick neuroprobe was used to probe the neuroforamen bilateral L5 and S1 these were determined to be well decompressed. Permanent C-arm images were obtained in AP and lateral plane and oblique planes. Remaining local bone graft was then applied along both lateral posterior lateral region extending from L5 to S1 facet beds.Gelfoam was then removed spinal canal. The lumbodorsal musculature carefully exam debrided of any devitalized tissue following removal of self retaining retractors were the bleeders were controlled using electrocautery and the area dorsal lumbar muscle were then approximated in the midline with interrupted #1 Vicryl sutures loose the dorsal fascia was reattached to the spinous process of L4  superiorly and L5-S1  inferiorly this was done with #1 Vicryl sutures. Subcutaneous layers then approximated using interrupted 0 Vicryl sutures and 2-0 Vicryl sutures. Skin was closed with a running subcutaneous stitch of 4-0 Vicryl Dermabond was applied then MedPlex bandage. All instrument and sponge counts were correct. The patient was then returned to a supine position on her bed reactivated extubated and returned to the recovery room in satisfactory condition.   Benjiman Core, PA-C perform the duties of assistant surgeon during this case. He was present from the beginning of the case to the end of the case assisting in transfer the patient from his stretcher to the OR table and back to the stretcher at the end of the case. Assisted in careful retraction and suction of the laminectomy site  delicate neural structures operating under the operating room microscope. He performed closure of the incision from the fascia to the skin applying the dressing.         Jessy Oto  05/09/2017, 11:48 AM

## 2017-05-09 NOTE — Transfer of Care (Signed)
Immediate Anesthesia Transfer of Care Note  Patient: [name]  Procedure(s) Performed: Procedure(s): Left transforaminal lumbar interbody fusion Lumbar 5-Sacral 1 with cages, pedicle screws and rods, local bone graft, allograft bone graft and Vivigen (Left)  Patient Location: PACU  Anesthesia Type:General  Level of Consciousness: awake, alert  and oriented  Airway & Oxygen Therapy: Patient Spontanous Breathing and Patient connected to nasal cannula oxygen  Post-op Assessment: Report given to RN and Post -op Vital signs reviewed and stable  Post vital signs: Reviewed and stable  Last Vitals:  Vitals:   05/09/17 0623 05/09/17 1204  BP: 112/73   Pulse: 80   Resp: 20   Temp: 36.9 C 36.4 C    Last Pain:  Vitals:   05/09/17 0623  TempSrc: Oral         Complications: No apparent anesthesia complications

## 2017-05-09 NOTE — Brief Op Note (Signed)
05/09/2017  11:43 AM  PATIENT:  [name]  61 y.o. 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 Shona SimpsonKevin Hollis.  EBL:  Total I/O In: 1800 [I.V.:1800] Out: 1075 [Urine:900; Blood:175]  BLOOD ADMINISTERED:none and 0 CC PRBC  DRAINS: Urinary Catheter (Foley)   LOCAL MEDICATIONS USED:  MARCAINE 0.5% 1:1 EXPAREL 1.3% Amount: 30 ml  SPECIMEN:  No Specimen  DISPOSITION OF SPECIMEN:  N/A  COUNTS:  YES  TOURNIQUET:  * No tourniquets in log *  DICTATION: .Dragon Dictation  PLAN OF CARE: Admit to inpatient   PATIENT DISPOSITION:  PACU - hemodynamically stable.   Delay start of Pharmacological VTE agent (>24hrs) due to surgical blood loss or risk of bleeding: yes

## 2017-05-09 NOTE — Progress Notes (Signed)
Orthopedic Tech Progress Note Patient Details:  [name] [birth year] [id number]  Patient ID: Daniel Lindauandall K Olsen, male   DOB: [birth year], 61 y.o.   MRN: [id number]   Saul FordyceJennifer C Cord Wilczynski 05/09/2017, 3:55 PMCalled [employer] for Lumbar brace.

## 2017-05-09 NOTE — Progress Notes (Signed)
Betadine nasal treatment for + staph on PCR- DONE.

## 2017-05-09 NOTE — H&P (Signed)
[name] is an 61 y.o. male.   Chief Complaint: low back pain and left LE radiculopathy  HPI: patient with hx of L5-S1 stenosis and above complaint presents for surgical intervention.  Progressively worsening symptoms.  Failed conservative treatment.    Past Medical History:  Diagnosis Date  . Depression   . GERD (gastroesophageal reflux disease)    OTC meds  . Headache   . History of kidney stones   . Hypertension   . Seizures (HCC)    several months since the last surgery    Past Surgical History:  Procedure Laterality Date  . JOINT REPLACEMENT Bilateral    hip right and left  . ROTATOR CUFF REPAIR Left   . TOTAL HIP ARTHROPLASTY Left 02/05/2015   Procedure: TOTAL HIP ARTHROPLASTY;  Surgeon: Nadara Mustard, MD;  Location: [facility] OR;  Service: Orthopedics;  Laterality: Left;    History reviewed. No pertinent family history. Social History:  reports that he has never smoked. He has never used smokeless tobacco. He reports that he drinks about 2.4 oz of alcohol per week . He reports that he does not use drugs.  Allergies:  Allergies  Allergen Reactions  . No Known Allergies     Medications Prior to Admission  Medication Sig Dispense Refill  . amLODipine (NORVASC) 5 MG tablet Take 5 mg by mouth daily.    . cimetidine (TAGAMET) 200 MG tablet Take 200 mg by mouth daily.    . citalopram (CELEXA) 20 MG tablet Take 20 mg by mouth daily.    Marland Kitchen HYDROcodone-acetaminophen (NORCO/VICODIN) 5-325 MG tablet Take 1 tablet by mouth every 6 (six) hours as needed for moderate pain. 40 tablet 0  . ibuprofen (ADVIL,MOTRIN) 600 MG tablet Take 600 mg by mouth every 8 (eight) hours as needed (FOR PAIN.).     Marland Kitchen Lacosamide 150 MG TABS Take 1 tablet (150 mg total) by mouth 2 (two) times daily. 60 tablet 5  . levETIRAcetam (KEPPRA) 750 MG tablet Take 750 mg by mouth 2 (two) times daily.    Marland Kitchen lisinopril (PRINIVIL,ZESTRIL) 40 MG tablet Take 40 mg by mouth daily.    . SUBOXONE 8-2 MG FILM Take 8 mg  by mouth 2 (two) times daily.     . methylPREDNISolone (MEDROL DOSEPAK) 4 MG TBPK tablet Take as directed. (Patient not taking: Reported on 05/04/2017) 21 tablet 0    No results found for this or any previous visit (from the past 48 hour(s)). No results found.  Review of Systems  Constitutional: Negative.   HENT: Negative.   Respiratory: Negative.   Cardiovascular: Negative.   Gastrointestinal: Negative.   Genitourinary: Negative.   Musculoskeletal: Positive for back pain.  Skin: Negative.   Neurological: Positive for tingling.  Psychiatric/Behavioral: Negative.     Blood pressure 112/73, pulse 80, temperature 98.5 F (36.9 C), temperature source Oral, resp. rate 20, SpO2 100 %. Physical Exam  Constitutional: He is oriented to person, place, and time. He appears well-developed. No distress.  HENT:  Head: Normocephalic.  Eyes: EOM are normal. Pupils are equal, round, and reactive to light.  Neck: Normal range of motion.  Respiratory: No respiratory distress.  GI: He exhibits no distension.  Musculoskeletal: He exhibits tenderness.  Neurological: He is alert and oriented to person, place, and time.  Skin: Skin is warm and dry.  Psychiatric: He has a normal mood and affect.     Assessment/Plan L5-S1 stenosis  Will proceed with Left transforaminal lumbar interbody fusion L5-S1 with  cages, pedicle screws and rods, local bone graft, allograft bone graft and Vivigen.  Surgical procedure along with possible risks/complications and recovery time discussed.  All questions answered and wishes to proceed.   Zonia KiefJames Owens, PA-C 05/09/2017, 6:34 AM

## 2017-05-09 NOTE — Interval H&P Note (Signed)
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 After consideration of risks, benefits and other options for treatment, the patient has consented to  Procedure(s): Left transforaminal lumbar interbody fusion L5-S1 with cages, pedicle screws and rods, local bone graft, allograft bone graft and Vivigen (N/A) as a surgical intervention .  The patient's history has been reviewed, patient examined, no change in status, stable for surgery.  I have reviewed the patient's chart and labs.  Questions were answered to the patient's satisfaction.     [name]

## 2017-05-09 NOTE — Anesthesia Procedure Notes (Addendum)
Procedure Name: Intubation Date/Time: 05/09/2017 7:37 AM Performed by: Merrilyn Puma B Pre-anesthesia Checklist: Patient identified, Emergency Drugs available, Suction available, Patient being monitored and Timeout performed Patient Re-evaluated:Patient Re-evaluated prior to inductionOxygen Delivery Method: Circle system utilized Preoxygenation: Pre-oxygenation with 100% oxygen Intubation Type: IV induction Ventilation: Mask ventilation without difficulty Laryngoscope Size: Mac and 4 Grade View: Grade I Tube type: Oral Tube size: 7.5 mm Number of attempts: 1 Airway Equipment and Method: Stylet Placement Confirmation: ETT inserted through vocal cords under direct vision,  positive ETCO2,  CO2 detector and breath sounds checked- equal and bilateral Secured at: 22 cm Tube secured with: Tape Dental Injury: Teeth and Oropharynx as per pre-operative assessment

## 2017-05-09 NOTE — Anesthesia Postprocedure Evaluation (Signed)
Anesthesia Post Note  Patient: [name]  Procedure(s) Performed: Procedure(s) (LRB): Left transforaminal lumbar interbody fusion Lumbar 5-Sacral 1 with cages, pedicle screws and rods, local bone graft, allograft bone graft and Vivigen (Left)  Patient location during evaluation: PACU Anesthesia Type: General Level of consciousness: awake and alert and oriented Pain management: pain level controlled Vital Signs Assessment: post-procedure vital signs reviewed and stable Respiratory status: spontaneous breathing, nonlabored ventilation, respiratory function stable and patient connected to nasal cannula oxygen Cardiovascular status: blood pressure returned to baseline and stable Postop Assessment: no signs of nausea or vomiting Anesthetic complications: no       Last Vitals:  Vitals:   05/09/17 1233 05/09/17 1234  BP: 118/84   Pulse: 90 86  Resp:  10  Temp:      Last Pain:  Vitals:   05/09/17 1245  TempSrc:   PainSc: 10-Worst pain ever                 Tacey Dimaggio A.

## 2017-05-10 LAB — CBC
HCT: 29.7 % — ABNORMAL LOW (ref 39.0–52.0)
HEMOGLOBIN: 9.6 g/dL — AB (ref 13.0–17.0)
MCH: 32.1 pg (ref 26.0–34.0)
MCHC: 32.3 g/dL (ref 30.0–36.0)
MCV: 99.3 fL (ref 78.0–100.0)
Platelets: 197 10*3/uL (ref 150–400)
RBC: 2.99 MIL/uL — ABNORMAL LOW (ref 4.22–5.81)
RDW: 14.1 % (ref 11.5–15.5)
WBC: 7.3 10*3/uL (ref 4.0–10.5)

## 2017-05-10 LAB — BASIC METABOLIC PANEL
ANION GAP: 6 (ref 5–15)
BUN: 9 mg/dL (ref 6–20)
CALCIUM: 7.9 mg/dL — AB (ref 8.9–10.3)
CHLORIDE: 105 mmol/L (ref 101–111)
CO2: 26 mmol/L (ref 22–32)
Creatinine, Ser: 0.88 mg/dL (ref 0.61–1.24)
GFR calc non Af Amer: >60 mL/min (ref >60)
Glucose, Bld: 110 mg/dL — ABNORMAL HIGH (ref 65–99)
Potassium: 4 mmol/L (ref 3.5–5.1)
Sodium: 137 mmol/L (ref 135–145)

## 2017-05-10 NOTE — Progress Notes (Addendum)
Subjective: Doing well.  C/o some back pain.  preop Left LE radiculopathy improved.     Objective: Vital signs in last 24 hours: Temp:  [97.5 F (36.4 C)-98.4 F (36.9 C)] 98.2 F (36.8 C) (05/15 0333) Pulse Rate:  [74-98] 79 (05/15 0333) Resp:  [7-28] 18 (05/15 0333) BP: (97-128)/(56-104) 100/66 (05/15 0333) SpO2:  [99 %-100 %] 99 % (05/15 0830)  Intake/Output from previous day: 05/14 0701 - 05/15 0700 In: 2150 [I.V.:2150] Out: 1875 [Urine:1700; Blood:175] Intake/Output this shift: No intake/output data recorded.   Recent Labs  05/10/17 0526  HGB 9.6*    Recent Labs  05/10/17 0526  WBC 7.3  RBC 2.99*  HCT 29.7*  PLT 197    Recent Labs  05/10/17 0526  NA 137  K 4.0  CL 105  CO2 26  BUN 9  CREATININE 0.88  GLUCOSE 110*  CALCIUM 7.9*    Recent Labs  05/09/17 0632  INR 0.99    Exam: Pleasant male alert and oriented.  NAD.  bilat calves nontender.  NVI.    Assessment/Plan: PT today.  Anticipate d/c home next day or two if making good progress.    Zonia KiefJames Owens 05/10/2017, 8:57 AM    Patient examined and lab reviewed with Barry Dieneswens, PA-C.

## 2017-05-10 NOTE — Evaluation (Signed)
Physical Therapy Evaluation Patient Details Name: Daniel LindauRandall K Robak MRN: [id number] DOB: [birth year] Today's Date: 05/10/2017   History of Present Illness  Pt is a 61 yo male s/p L transforaminal lumbar interbody fusion L5-S1 with local, allograft bone graft. PMH: HTN, seizures. 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  Clinical Impression  Pt tolerating OOB mobiltiy well for first time up. Recommend heal lift for R LE due to leg length discrepancy and to promote optimal healing/support to recent back operation. Acute PT to con' to follow.    Follow Up Recommendations No PT follow up;Supervision/Assistance - 24 hour    Equipment Recommendations  Rolling walker with 5" wheels;3in1 (PT)    Recommendations for Other Services       Precautions / Restrictions Precautions Precautions: Back Precaution Booklet Issued: Yes (comment) Precaution Comments: pt with verbal understanding and read handout Restrictions Weight Bearing Restrictions: No      Mobility  Bed Mobility Overal bed mobility: Needs Assistance Bed Mobility: Rolling;Sidelying to Sit;Sit to Sidelying Rolling: Supervision Sidelying to sit: Supervision     Sit to sidelying: Supervision General bed mobility comments: v/c's for technique to adhere to back precautions but able to complete without physical assist  Transfers Overall transfer level: Needs assistance Equipment used: Rolling walker (2 wheeled) Transfers: Sit to/from Stand Sit to Stand: Min guard         General transfer comment: v/c's for hand placement  Ambulation/Gait Ambulation/Gait assistance: Min guard Ambulation Distance (Feet): 600 Feet Assistive device: Rolling walker (2 wheeled);None Gait Pattern/deviations: Step-through pattern Gait velocity: wfl Gait velocity interpretation: at or above normal speed for age/gender General Gait Details: began with RW however transitioned away from walker. pt with leg length discrepency and recommend getting  a R heal lift. no episodes of LOB  Stairs Stairs: Yes Stairs assistance: Min guard Stair Management: One rail Right Number of Stairs: 3 General stair comments: had to use railing today but discussed dtr assisting pt on stairs without rail  Wheelchair Mobility    Modified Rankin (Stroke Patients Only)       Balance Overall balance assessment: No apparent balance deficits (not formally assessed)                                           Pertinent Vitals/Pain Pain Assessment: 0-10 Pain Score: 9  Pain Location: surgical sight Pain Descriptors / Indicators: Sore Pain Intervention(s): Monitored during session    Home Living Family/patient expects to be discharged to:: Private residence Living Arrangements: Alone (but going to stay with daughter) Available Help at Discharge: Family;Available 24 hours/day Type of Home: House Home Access: Stairs to enter Entrance Stairs-Rails: None Entrance Stairs-Number of Steps: 3 Home Layout: One level Home Equipment: Walker - 2 wheels;Bedside commode Additional Comments: pt going to daughters house     Prior Function Level of Independence: Independent               Hand Dominance        Extremity/Trunk Assessment   Upper Extremity Assessment Upper Extremity Assessment: Overall WFL for tasks assessed    Lower Extremity Assessment Lower Extremity Assessment: Overall WFL for tasks assessed (R LE shorter than L)    Cervical / Trunk Assessment Cervical / Trunk Assessment: Other exceptions Cervical / Trunk Exceptions: recent back surgery  Communication   Communication: No difficulties  Cognition Arousal/Alertness: Awake/alert Behavior During Therapy:  WFL for tasks assessed/performed Overall Cognitive Status: Within Functional Limits for tasks assessed                                        General Comments General comments (skin integrity, edema, etc.): pt able to don brace with minimal  assist and guidance    Exercises     Assessment/Plan    PT Assessment Patient needs continued PT services  PT Problem List Decreased strength;Decreased activity tolerance;Decreased balance;Decreased mobility;Pain       PT Treatment Interventions DME instruction;Gait training;Stair training;Functional mobility training;Therapeutic activities;Therapeutic exercise;Balance training;Neuromuscular re-education    PT Goals (Current goals can be found in the Care Plan section)  Acute Rehab PT Goals Patient Stated Goal: home PT Goal Formulation: With patient Time For Goal Achievement: 05/17/17 Potential to Achieve Goals: Good    Frequency Min 5X/week   Barriers to discharge        Co-evaluation               AM-PAC PT "6 Clicks" Daily Activity  Outcome Measure Difficulty turning over in bed (including adjusting bedclothes, sheets and blankets)?: A Little Difficulty moving from lying on back to sitting on the side of the bed? : A Little Difficulty sitting down on and standing up from a chair with arms (e.g., wheelchair, bedside commode, etc,.)?: A Little Help needed moving to and from a bed to chair (including a wheelchair)?: A Little Help needed walking in hospital room?: A Little Help needed climbing 3-5 steps with a railing? : A Little 6 Click Score: 18    End of Session Equipment Utilized During Treatment: Gait belt;Back brace Activity Tolerance: Patient tolerated treatment well Patient left: in chair;with call bell/phone within reach Nurse Communication: Mobility status PT Visit Diagnosis: Muscle weakness (generalized) (M62.81);Difficulty in walking, not elsewhere classified (R26.2)    Time: 1030-1055 PT Time Calculation (min) (ACUTE ONLY): 25 min   Charges:   PT Evaluation $PT Eval Moderate Complexity: 1 Procedure PT Treatments $Gait Training: 8-22 mins   PT G Codes:        Lewis Shock, PT, DPT Pager #: [phone] Office #: [phone]   Arcelia Pals M  Dionisios Ricci 05/10/2017, 11:36 AM

## 2017-05-10 NOTE — Evaluation (Signed)
Occupational Therapy Evaluation Patient Details Name: [name] MRN: [id number] DOB: 10/25/1956 Today's Date: 05/10/2017    History of Present Illness Pt is a 61 yo male s/p L transforaminal lumbar interbody fusion L5-S1 with local, allograft bone graft. PMH: HTN, seizures. 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   Clinical Impression   PTA, pt was independent and living on his own. Pt reports that he will dc to his daughters house. Currently, pt is performing ADLs and functional mobility at a supervision-Min guard level. Pt demonstrated understanding of LB dressing, toilet transfer, and shower transfer. Pt requires Min A and VCs for donning aspen brace, but feel pt is able to perform and verbalize progress to daughter. Reviewed back precautions and pt able to recall 3/3 at end of session. Recommend pt dc home once medically stable per physician. All education provided and acute OT need met. Will sign off. Thank you.     Follow Up Recommendations  No OT follow up;Supervision - Intermittent    Equipment Recommendations  None recommended by OT    Recommendations for Other Services       Precautions / Restrictions Precautions Precautions: Back Precaution Booklet Issued: Yes (comment) Precaution Comments: Reviewed back precautions. Pt required VCs to recall at begining of session, but was able to recall independently by end of session. Restrictions Weight Bearing Restrictions: No      Mobility Bed Mobility Overal bed mobility: Needs Assistance Bed Mobility: Rolling;Sidelying to Sit;Sit to Sidelying Rolling: Supervision Sidelying to sit: Supervision     Sit to sidelying: Supervision General bed mobility comments: Pt demonstrated good technique and log roll  Transfers Overall transfer level: Needs assistance Equipment used: Rolling walker (2 wheeled) Transfers: Sit to/from Stand Sit to Stand: Min guard         General transfer comment: v/c's for hand  placement    Balance Overall balance assessment: No apparent balance deficits (not formally assessed)                                         ADL either performed or assessed with clinical judgement   ADL Overall ADL's : Needs assistance/impaired Eating/Feeding: Set up;Sitting   Grooming: Set up;Standing;Supervision/safety   Upper Body Bathing: Sitting;Minimal assistance Upper Body Bathing Details (indicate cue type and reason): Min A for back Lower Body Bathing: Min guard;Sit to/from stand (Showed AE and educated pt on where to purchase) Lower Body Bathing Details (indicate cue type and reason): Demonstrated good ROM while adhering to precautions Upper Body Dressing : Minimal assistance;Cueing for sequencing;Sitting Upper Body Dressing Details (indicate cue type and reason): Pt required Min A for donning aspen brace. However, demosnterated good understanding. Just required cues to slow down during task. Lower Body Dressing: Min guard;Sit to/from stand (Showed AE and educated pt on where to purchase) Lower Body Dressing Details (indicate cue type and reason): Pt donned underwear and socks while adhereing to precautions. Able to bring ankle to knee. Instructed pt on AE use if pt desires to purchase.  Toilet Transfer: Min guard;Ambulation;Regular Toilet       Tub/ Banker: Gaffer;Ambulation;3 in 1 Tub/Shower Transfer Details (indicate cue type and reason): Discussed proper technique for safe transfer. Pt verbalized understanding Functional mobility during ADLs: Min guard General ADL Comments: Pt demonstrating good fucntional performance with need for VCs to don back brace. Overall ,he required supervision to [facility]  guard A.      Vision         Perception     Praxis      Pertinent Vitals/Pain Pain Assessment: Faces Faces Pain Scale: Hurts even more Pain Location: surgical sight Pain Descriptors / Indicators: Sore Pain Intervention(s): Monitored  during session     Hand Dominance Right   Extremity/Trunk Assessment Upper Extremity Assessment Upper Extremity Assessment: Overall WFL for tasks assessed   Lower Extremity Assessment Lower Extremity Assessment: Overall WFL for tasks assessed   Cervical / Trunk Assessment Cervical / Trunk Assessment: Other exceptions Cervical / Trunk Exceptions: recent back surgery   Communication Communication Communication: No difficulties   Cognition Arousal/Alertness: Awake/alert Behavior During Therapy: WFL for tasks assessed/performed Overall Cognitive Status: Within Functional Limits for tasks assessed                                     General Comments  Pt with difficulty recalling precaution, but able to recall 3/3 at end of session    Exercises     Shoulder Instructions      Home Living Family/patient expects to be discharged to:: Private residence Living Arrangements: Alone (but going to stay with daughter) Available Help at Discharge: Family;Available 24 hours/day Type of Home: House Home Access: Stairs to enter [employer] of Steps: 3 Entrance Stairs-Rails: None Home Layout: One level     Bathroom Shower/Tub: Occupational psychologist: Standard     Home Equipment: Environmental consultant - 2 wheels;Bedside commode   Additional Comments: Pt going to daughters house at dc.       Prior Functioning/Environment Level of Independence: Independent        Comments: Pt was performing ADLS, IADLs, and driving prior. Enjoys spending time with his grandchildren        OT Problem List: Decreased range of motion;Decreased activity tolerance;Decreased knowledge of use of DME or AE;Decreased knowledge of precautions;Pain      OT Treatment/Interventions:      OT Goals(Current goals can be found in the care plan section) Acute Rehab OT Goals Patient Stated Goal: Go home with his daughter  OT Goal Formulation: With patient Time For Goal Achievement:  05/24/17 Potential to Achieve Goals: Good  OT Frequency:     Barriers to D/C:            Co-evaluation              AM-PAC PT "6 Clicks" Daily Activity     Outcome Measure Help from another person eating meals?: None Help from another person taking care of personal grooming?: None Help from another person toileting, which includes using toliet, bedpan, or urinal?: A Little Help from another person bathing (including washing, rinsing, drying)?: A Little Help from another person to put on and taking off regular upper body clothing?: None Help from another person to put on and taking off regular lower body clothing?: A Little 6 Click Score: 21   End of Session Equipment Utilized During Treatment: Other (comment) (Aspen brace) Nurse Communication: Mobility status;Precautions  Activity Tolerance: Patient tolerated treatment well Patient left: in bed;with call bell/phone within reach  OT Visit Diagnosis: Pain;Other (comment) (Back precautions) Pain - Right/Left:  (Back) Pain - part of body:  (Back)                Time: 7564-3329 OT Time Calculation (min): 20 min Charges:  OT General  Charges $OT Visit: 1 Procedure OT Evaluation $OT Eval Low Complexity: 1 Procedure G-Codes:      , OTR/L [phone]  [place] 05/10/2017, 4:29 PM

## 2017-05-11 MED ORDER — OXYCODONE-ACETAMINOPHEN 7.5-325 MG PO TABS: 1.0000 | ORAL_TABLET | Freq: Four times a day (QID) | ORAL | 0 refills | Status: DC | PRN

## 2017-05-11 MED ORDER — METHOCARBAMOL 500 MG PO TABS: 500.0000 mg | ORAL_TABLET | Freq: Four times a day (QID) | ORAL | 0 refills | Status: AC | PRN

## 2017-05-11 NOTE — Progress Notes (Signed)
Physical Therapy Treatment Patient Details Name: [name] MRN: [id number] DOB: [birth year] Today's Date: 05/11/2017    History of Present Illness Pt is a 61 yo male s/p L transforaminal lumbar interbody fusion L5-S1 with local, allograft bone graft. PMH: HTN, seizures. 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    PT Comments    Patient was seen with family present, dressed and ready for discharge. He and family report no concerns about mobility once pt returns home. Pt ambulated in hallway with RW for 510 ft and without RW for 280 ft. Overall steady with gait, no LOB. Expected to d/c this afternoon.   Follow Up Recommendations  No PT follow up;Supervision/Assistance - 24 hour     Equipment Recommendations  Rolling walker with 5" wheels;3in1 (PT)    Recommendations for Other Services       Precautions / Restrictions Precautions Precautions: Back Precaution Booklet Issued: Yes (comment) (pt unable to recall precautions w/o assist) Precaution Comments: Reviewed back precautions. Pt required VCs to recall at begining of session, but was able to recall independently by end of session. Restrictions Weight Bearing Restrictions: No    Mobility  Bed Mobility               General bed mobility comments: in chair on arrival  Transfers Overall transfer level: Needs assistance Equipment used: Rolling walker (2 wheeled) Transfers: Sit to/from Stand Sit to Stand: Supervision            Ambulation/Gait Ambulation/Gait assistance: Min guard;Supervision Ambulation Distance (Feet): 790 Feet Assistive device: Rolling walker (2 wheeled);None Gait Pattern/deviations: Step-through pattern   Gait velocity interpretation: at or above normal speed for age/gender General Gait Details: Quick gait. Began with RW however transitioned away from walker. pt steady with gait.   Stairs         General stair comments: pt reported comfortable with stairs  Wheelchair  Mobility    Modified Rankin (Stroke Patients Only)       Balance Overall balance assessment: No apparent balance deficits (not formally assessed)                                          Cognition Arousal/Alertness: Awake/alert Behavior During Therapy: WFL for tasks assessed/performed Overall Cognitive Status: Within Functional Limits for tasks assessed                                        Exercises      General Comments General comments (skin integrity, edema, etc.): needs assist to recall precaution      Pertinent Vitals/Pain Pain Assessment: 0-10 Pain Score: 9  Pain Location: surgical sight Pain Descriptors / Indicators: Sore Pain Intervention(s): Monitored during session;Limited activity within patient's tolerance    Home Living                      Prior Function            PT Goals (current goals can now be found in the care plan section) Acute Rehab PT Goals Patient Stated Goal: Go home with his daughter  PT Goal Formulation: With patient Time For Goal Achievement: 05/17/17 Potential to Achieve Goals: Good    Frequency    Min 5X/week      PT Plan  Current plan remains appropriate    Co-evaluation              AM-PAC PT "6 Clicks" Daily Activity  Outcome Measure  Difficulty turning over in bed (including adjusting bedclothes, sheets and blankets)?: A Little Difficulty moving from lying on back to sitting on the side of the bed? : A Little Difficulty sitting down on and standing up from a chair with arms (e.g., wheelchair, bedside commode, etc,.)?: A Little Help needed moving to and from a bed to chair (including a wheelchair)?: A Little Help needed walking in hospital room?: A Little Help needed climbing 3-5 steps with a railing? : A Little 6 Click Score: 18    End of Session Equipment Utilized During Treatment: Gait belt;Back brace Activity Tolerance: Patient tolerated treatment  well Patient left: in chair;with call bell/phone within reach Nurse Communication: Mobility status PT Visit Diagnosis: Muscle weakness (generalized) (M62.81);Difficulty in walking, not elsewhere classified (R26.2)     Time: [id number] PT Time Calculation (min) (ACUTE ONLY): 12 min  Charges:  $Gait Training: 8-22 mins                    G Codes:       Kallie LocksHannah Yobany Vroom, PTA Acute Rehab [phone]   Sheral ApleyHannah E Kelty Szafran 05/11/2017, 2:34 PM

## 2017-05-11 NOTE — Progress Notes (Signed)
Patient ID: [name], male   DOB: 1956/08/24, 61 y.o.   MRN: [id number] Did well with PT, daughter is able to assist at home.  He wishes to be discharged.

## 2017-05-11 NOTE — Progress Notes (Signed)
Subjective: Doing well.  Pain controlled.  Making good progress with PT.  Did long hall ambulation and stairs.    Objective: Vital signs in last 24 hours: Temp:  [98 F (36.7 C)-100.4 F (38 C)] 98 F (36.7 C) (05/16 0449) Pulse Rate:  [79-95] 79 (05/16 0449) Resp:  [12-18] 18 (05/16 0449) BP: (91-112)/(51-70) 96/59 (05/16 0449) SpO2:  [98 %-100 %] 98 % (05/16 0449)  Intake/Output from previous day: 05/15 0701 - 05/16 0700 In: 1451 [P.O.:476; I.V.:975] Out: 150 [Urine:150] Intake/Output this shift: No intake/output data recorded.   Recent Labs  05/10/17 0526  HGB 9.6*    Recent Labs  05/10/17 0526  WBC 7.3  RBC 2.99*  HCT 29.7*  PLT 197    Recent Labs  05/10/17 0526  NA 137  K 4.0  CL 105  CO2 26  BUN 9  CREATININE 0.88  GLUCOSE 110*  CALCIUM 7.9*    Recent Labs  05/09/17 [id number]  INR 0.99   Exam; Pleasant male alert and oriented, NAD.  Wound looks good.  steris intact.  No drainage or signs of infection.  bilat calves nontender.    Assessment/Plan: Anticipate d/c home this afternoon if continues to progress and can arrange with daughter for pickup.  Scripts on chart for percocet and robaxin.    Zonia KiefJames Amadea Keagy 05/11/2017, 8:49 AM

## 2017-05-12 DIAGNOSIS — Z4789 Encounter for other orthopedic aftercare: Secondary | ICD-10-CM | POA: Diagnosis not present

## 2017-05-12 DIAGNOSIS — F329 Major depressive disorder, single episode, unspecified: Secondary | ICD-10-CM | POA: Diagnosis not present

## 2017-05-12 DIAGNOSIS — I10 Essential (primary) hypertension: Secondary | ICD-10-CM | POA: Diagnosis not present

## 2017-05-12 DIAGNOSIS — K219 Gastro-esophageal reflux disease without esophagitis: Secondary | ICD-10-CM | POA: Diagnosis not present

## 2017-05-17 DIAGNOSIS — K219 Gastro-esophageal reflux disease without esophagitis: Secondary | ICD-10-CM | POA: Diagnosis not present

## 2017-05-17 DIAGNOSIS — I10 Essential (primary) hypertension: Secondary | ICD-10-CM | POA: Diagnosis not present

## 2017-05-17 DIAGNOSIS — F329 Major depressive disorder, single episode, unspecified: Secondary | ICD-10-CM | POA: Diagnosis not present

## 2017-05-17 DIAGNOSIS — Z4789 Encounter for other orthopedic aftercare: Secondary | ICD-10-CM | POA: Diagnosis not present

## 2017-05-18 ENCOUNTER — Telehealth (INDEPENDENT_AMBULATORY_CARE_PROVIDER_SITE_OTHER): Payer: Self-pay | Admitting: Specialist

## 2017-05-18 NOTE — Telephone Encounter (Signed)
Patient called needing Rx refilled (Oxycodone) The number to contact patient is [phone]

## 2017-05-19 ENCOUNTER — Other Ambulatory Visit (INDEPENDENT_AMBULATORY_CARE_PROVIDER_SITE_OTHER): Payer: Self-pay | Admitting: Specialist

## 2017-05-19 DIAGNOSIS — I10 Essential (primary) hypertension: Secondary | ICD-10-CM | POA: Diagnosis not present

## 2017-05-19 DIAGNOSIS — K219 Gastro-esophageal reflux disease without esophagitis: Secondary | ICD-10-CM | POA: Diagnosis not present

## 2017-05-19 DIAGNOSIS — F329 Major depressive disorder, single episode, unspecified: Secondary | ICD-10-CM | POA: Diagnosis not present

## 2017-05-19 DIAGNOSIS — Z4789 Encounter for other orthopedic aftercare: Secondary | ICD-10-CM | POA: Diagnosis not present

## 2017-05-19 MED ORDER — OXYCODONE-ACETAMINOPHEN 7.5-325 MG PO TABS: 1.0000 | ORAL_TABLET | Freq: Four times a day (QID) | ORAL | 0 refills | Status: DC | PRN

## 2017-05-19 NOTE — Progress Notes (Signed)
Patinet is aware that his rx is ready for pick up at the front desk

## 2017-05-20 ENCOUNTER — Ambulatory Visit (INDEPENDENT_AMBULATORY_CARE_PROVIDER_SITE_OTHER): Payer: Medicare Other | Admitting: Specialist

## 2017-05-20 ENCOUNTER — Ambulatory Visit (INDEPENDENT_AMBULATORY_CARE_PROVIDER_SITE_OTHER): Payer: Medicare Other

## 2017-05-20 ENCOUNTER — Encounter (INDEPENDENT_AMBULATORY_CARE_PROVIDER_SITE_OTHER): Payer: Self-pay | Admitting: Specialist

## 2017-05-20 VITALS — BP 140/87 | HR 110 | Ht 68.0 in | Wt 142.0 lb

## 2017-05-20 DIAGNOSIS — Z981 Arthrodesis status: Secondary | ICD-10-CM

## 2017-05-20 DIAGNOSIS — M2170 Unequal limb length (acquired), unspecified site: Secondary | ICD-10-CM

## 2017-05-20 MED ORDER — MUPIROCIN CALCIUM 2 % EX CREA: 1.0000 "application " | TOPICAL_CREAM | Freq: Two times a day (BID) | CUTANEOUS | 0 refills | Status: AC

## 2017-05-20 NOTE — Patient Instructions (Signed)
    Call if there is increasing drainage, fever greater than 101.5, severe head aches, and worsening nausea or light sensitivity. If shortness of breath, bloody cough or chest tightness or pain go to an emergency room. No lifting greater than 10 lbs. Avoid bending, stooping and twisting. Use brace when sitting and out of bed even to go to bathroom. Walk in house for first 2 weeks then may start to get out slowly increasing distances up to one mile by 4-6 weeks post op. After 5 days may shower and change dressing following bathing with shower.When bathing remove the brace shower and replace brace before getting out of the shower. If drainage, keep dry dressing and do not bathe the incision, use an moisture impervious dressing. Please call and return for scheduled follow up appointment 4 weeks from the time of surgery.

## 2017-05-20 NOTE — Progress Notes (Signed)
Post-Op Visit Note   Patient: [name]           Date of Birth: [birth year]           MRN: [id number] Visit Date: 05/20/2017 PCP: Simone Curia, MD   Assessment & Plan:11 days post op TLIF fusion L5-S1, incision with some minimal reddness.  Chief Complaint:  Chief Complaint  Patient presents with  . Lower Back - Routine Post Op   Visit Diagnoses:  1. S/P lumbar fusion    61 year old male now 11 days following L5-S1 foramenal decompression with TLIF.  A little bit of numbness and Paresthesias left foot. Narcotics to be renewed, this weekend is Memorial Day weekend and a new rx is appropriate.  No bowel or bladder. Motor is normal, SLR is negative. Incision with mild erthryema.   Plan:Apply mupuricin cream to the incision BID for 1 week then discontinue  Call if there is increasing drainage, fever greater than 101.5, severe head aches, and worsening nausea or light sensitivity. If shortness of breath, bloody cough or chest tightness or pain go to an emergency room. No lifting greater than 10 lbs. Avoid bending, stooping and twisting. Use brace when sitting and out of bed even to go to bathroom. Walk in house for first 2 weeks then may start to get out slowly increasing distances up to one mile by 4-6 weeks post op. After 5 days may shower and change dressing following bathing with shower.When bathing remove the brace shower and replace brace before getting out of the shower. If drainage, keep dry dressing and do not bathe the incision, use an moisture impervious dressing. Please call and return for scheduled follow up appointment 4 weeks from the time of surgery.    Follow-Up Instructions: No Follow-up on file.   Orders:  Orders Placed This Encounter  Procedures  . XR Lumbar Spine 2-3 Views   No orders of the defined types were placed in this encounter.   Imaging: Xr Lumbar Spine 2-3 Views  Result Date: 05/20/2017 AP and lateral of the lumbar spine with  pedicle screws and rods fixing the L5-S1 level with cage at the intervertebral disc level and additional bone graft. No abnormalities of the hardware seen.    PMFS History: Patient Active Problem List   Diagnosis Date Noted  . Spinal stenosis of lumbar region 05/09/2017    Priority: High    Class: Chronic  . Localization-related idiopathic epilepsy and epileptic syndromes with seizures of localized onset, not intractable, with status epilepticus (HCC) 03/31/2017  . Medication monitoring encounter 03/31/2017  . Drug induced subacute dyskinesia 03/01/2017  . Seizure disorder (HCC) 03/01/2017  . Spinal stenosis of lumbar region with neurogenic claudication 03/01/2017  . Sciatica of left side 03/01/2017  . 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 Acute left-sided low back pain with left-sided sciatica 12/02/2016  . Avascular necrosis of hip (HCC) 02/05/2015   Past Medical History:  Diagnosis Date  . Depression   . GERD (gastroesophageal reflux disease)    OTC meds  . Headache   . History of kidney stones   . Hypertension   . Seizures (HCC)    several months since the last surgery    No family history on file.  Past Surgical History:  Procedure Laterality Date  . JOINT REPLACEMENT Bilateral    hip right and left  . ROTATOR CUFF REPAIR Left   . TOTAL HIP ARTHROPLASTY Left 02/05/2015   Procedure: TOTAL HIP  ARTHROPLASTY;  Surgeon: Nadara MustardMarcus Duda V, MD;  [facility];  Service: Orthopedics;  Laterality: Left;   Social History   Occupational History  . Not on file.   Social History Main Topics  . Smoking status: Never Smoker  . Smokeless tobacco: Never Used  . Alcohol use 2.4 oz/week    4 Cans of beer per week  . Drug use: No  . Sexual activity: Not on file

## 2017-05-24 ENCOUNTER — Telehealth (INDEPENDENT_AMBULATORY_CARE_PROVIDER_SITE_OTHER): Payer: Self-pay

## 2017-05-24 DIAGNOSIS — F329 Major depressive disorder, single episode, unspecified: Secondary | ICD-10-CM | POA: Diagnosis not present

## 2017-05-24 DIAGNOSIS — I10 Essential (primary) hypertension: Secondary | ICD-10-CM | POA: Diagnosis not present

## 2017-05-24 DIAGNOSIS — Z4789 Encounter for other orthopedic aftercare: Secondary | ICD-10-CM | POA: Diagnosis not present

## 2017-05-24 DIAGNOSIS — K219 Gastro-esophageal reflux disease without esophagitis: Secondary | ICD-10-CM | POA: Diagnosis not present

## 2017-05-24 NOTE — Telephone Encounter (Signed)
Patient stated that he had surgery last Friday and the back brace that he was given is now broke.  Stated that it can not be fixed, he would need a new one.  CB# is [phone].

## 2017-05-24 NOTE — Telephone Encounter (Signed)
Patient stated that he had surgery last Friday and the back brace that he was given is now broke.  Stated that it can not be fixed, he would need a new one

## 2017-05-24 NOTE — Addendum Note (Signed)
Addended by: Penne LashSHUE WILLS, Otis DialsHRISTY N on: 05/24/2017 09:57 AM   Modules accepted: Orders

## 2017-05-26 ENCOUNTER — Telehealth (INDEPENDENT_AMBULATORY_CARE_PROVIDER_SITE_OTHER): Payer: Self-pay | Admitting: Specialist

## 2017-05-26 NOTE — Telephone Encounter (Signed)
Patient called asked for a call back as soon as possible concerning the back brace he requested. Patient said he has very little support since the brace broke. Patient asked where can he get another one. The number to contact patient is [phone]

## 2017-05-27 NOTE — Telephone Encounter (Signed)
I spoke with Dr. Otelia SergeantNitka, he advised to call [facility] to see if it came from them, and see what they would do with it. I called [facility], they said to have the patient to bring it back to them they would have to look at it and they would either fix it or replace it.  I called and lmom for pt to advise him of this.

## 2017-05-28 DIAGNOSIS — I10 Essential (primary) hypertension: Secondary | ICD-10-CM | POA: Diagnosis not present

## 2017-05-28 DIAGNOSIS — K219 Gastro-esophageal reflux disease without esophagitis: Secondary | ICD-10-CM | POA: Diagnosis not present

## 2017-05-28 DIAGNOSIS — Z4789 Encounter for other orthopedic aftercare: Secondary | ICD-10-CM | POA: Diagnosis not present

## 2017-05-28 DIAGNOSIS — F329 Major depressive disorder, single episode, unspecified: Secondary | ICD-10-CM | POA: Diagnosis not present

## 2017-05-30 ENCOUNTER — Telehealth (INDEPENDENT_AMBULATORY_CARE_PROVIDER_SITE_OTHER): Payer: Self-pay

## 2017-05-30 ENCOUNTER — Inpatient Hospital Stay: Admission: RE | Admit: 2017-05-30 | Payer: Medicare Other | Source: Ambulatory Visit

## 2017-05-30 NOTE — Telephone Encounter (Signed)
I called [facility], I advised them that brace broke, they advised to have patient to bring in and they would look at it and fix or replace it.  I called and lmom advising patient of this on Friday

## 2017-05-30 NOTE — Telephone Encounter (Signed)
FYI- [facility] called and stated that patient did not show for appointment.

## 2017-05-30 NOTE — Telephone Encounter (Signed)
FYI---Patient did not show up for CT of bone length.

## 2017-05-30 NOTE — Telephone Encounter (Signed)
He will have to wait for shoe lift until we have information about the amount of leg length discrepancy present. jen

## 2017-05-30 NOTE — Telephone Encounter (Signed)
[facility] jen

## 2017-06-01 ENCOUNTER — Other Ambulatory Visit (INDEPENDENT_AMBULATORY_CARE_PROVIDER_SITE_OTHER): Payer: Self-pay | Admitting: Radiology

## 2017-06-01 ENCOUNTER — Other Ambulatory Visit (INDEPENDENT_AMBULATORY_CARE_PROVIDER_SITE_OTHER): Payer: Self-pay | Admitting: Specialist

## 2017-06-01 MED ORDER — OXYCODONE-ACETAMINOPHEN 7.5-325 MG PO TABS: 1.0000 | ORAL_TABLET | Freq: Four times a day (QID) | ORAL | 0 refills | Status: DC | PRN

## 2017-06-01 NOTE — Telephone Encounter (Signed)
Patient is requesting a prescription refill for oxycodone. Please advise.

## 2017-06-01 NOTE — Addendum Note (Signed)
Addended by: Penne LashSHUE WILLS, Otis DialsHRISTY N on: 06/01/2017 09:51 AM   Modules accepted: Orders

## 2017-06-01 NOTE — Telephone Encounter (Signed)
I called and advised that his rx is ready for pick up at the front desk

## 2017-06-01 NOTE — Telephone Encounter (Signed)
Rx for oxycodone with APAP renewed #50. [facility] website assessed no other prescribers, last Rx from us  05/21/2017.

## 2017-06-02 NOTE — Discharge Summary (Signed)
Patient ID: [name] MRN: [id number] DOB/AGE: Jul 16, 1956 61 y.o.  Admit date: 05/09/2017 Discharge date: 06/02/2017  Admission Diagnoses:  Principal Problem:   Spinal stenosis of lumbar region Active Problems:   Spinal stenosis of lumbar region with neurogenic claudication   Discharge Diagnoses:  Principal Problem:   Spinal stenosis of lumbar region Active Problems:   Spinal stenosis of lumbar region with neurogenic claudication  status post Procedure(s): Left transforaminal lumbar interbody fusion Lumbar 5-Sacral 1 with cages, pedicle screws and rods, local bone graft, allograft bone graft and Vivigen  Past Medical History:  Diagnosis Date  . Depression   . GERD (gastroesophageal reflux disease)    OTC meds  . Headache   . History of kidney stones   . Hypertension   . Seizures (HCC)    several months since the last surgery    Surgeries: Procedure(s): Left transforaminal lumbar interbody fusion Lumbar 5-Sacral 1 with cages, pedicle screws and rods, local bone graft, allograft bone graft and Vivigen on 05/09/2017   Consultants:   Discharged Condition: Improved  Hospital Course: [name] is an 61 y.o. male who was admitted 05/09/2017 for operative treatment of Spinal stenosis of lumbar region. Patient failed conservative treatments (please see the history and physical for the specifics) and had severe unremitting pain that affects sleep, daily activities and work/hobbies. After pre-op clearance, the patient was taken to the operating room on 05/09/2017 and underwent  Procedure(s): Left transforaminal lumbar interbody fusion Lumbar 5-Sacral 1 with cages, pedicle screws and rods, local bone graft, allograft bone graft and Vivigen.    Patient was given perioperative antibiotics:  Anti-infectives    Start     Dose/Rate Route Frequency Ordered Stop   05/09/17 1530  ceFAZolin (ANCEF) IVPB 1 g/50 mL premix     1 g 100 mL/hr over 30 Minutes Intravenous Every 8  hours 05/09/17 1435 05/09/17 2304   05/09/17 0631  ceFAZolin (ANCEF) 2-4 GM/100ML-% IVPB    Comments:  Mechele Collin, Beth   : cabinet override      05/09/17 0631 05/09/17 0746   05/09/17 0628  ceFAZolin (ANCEF) IVPB 2g/100 mL premix     2 g 200 mL/hr over 30 Minutes Intravenous On call to O.R. 05/09/17 1324 05/09/17 0746       Patient was given sequential compression devices and early ambulation to prevent DVT.   Patient benefited maximally from hospital stay and there were no complications. At the time of discharge, the patient was urinating/moving their bowels without difficulty, tolerating a regular diet, pain is controlled with oral pain medications and they have been cleared by PT/OT.   Recent vital signs: No data found.    Recent laboratory studies: No results for input(s): WBC, HGB, HCT, PLT, NA, K, CL, CO2, BUN, CREATININE, GLUCOSE, INR, CALCIUM in the last 72 hours.  Invalid input(s): PT, 2   Discharge Medications:   Allergies as of 05/11/2017      Reactions   No Known Allergies       Medication List    STOP taking these medications   HYDROcodone-acetaminophen 5-325 MG tablet Commonly known as:  NORCO/VICODIN   ibuprofen 600 MG tablet Commonly known as:  ADVIL,MOTRIN   methylPREDNISolone 4 MG Tbpk tablet Commonly known as:  MEDROL DOSEPAK     TAKE these medications   amLODipine 5 MG tablet Commonly known as:  NORVASC Take 5 mg by mouth daily.   cimetidine 200 MG tablet Commonly known as:  TAGAMET Take 200  mg by mouth daily.   citalopram 20 MG tablet Commonly known as:  CELEXA Take 20 mg by mouth daily.   Lacosamide 150 MG Tabs Take 1 tablet (150 mg total) by mouth 2 (two) times daily.   levETIRAcetam 750 MG tablet Commonly known as:  KEPPRA Take 750 mg by mouth 2 (two) times daily.   lisinopril 40 MG tablet Commonly known as:  PRINIVIL,ZESTRIL Take 40 mg by mouth daily.   methocarbamol 500 MG tablet Commonly known as:  ROBAXIN Take 1 tablet (500  mg total) by mouth every 6 (six) hours as needed for muscle spasms.   SUBOXONE 8-2 MG Film Generic drug:  Buprenorphine HCl-Naloxone HCl Take 8 mg by mouth 2 (two) times daily.       Diagnostic Studies: Dg Lumbar Spine Complete  Result Date: 05/09/2017 CLINICAL DATA:  Status post surgical posterior fusion of L5-S1. EXAM: DG C-ARM 61-120 MIN; LUMBAR SPINE - COMPLETE 4+ VIEW FLUOROSCOPY TIME:  1 minutes 12 seconds. COMPARISON:  MRI of January 03, 2017. FINDINGS: Four intraoperative fluoroscopic images of the lower lumbar spine demonstrate the patient be status post surgical posterior fusion of L5-S1 with bilateral intrapedicular screw placement. Good alignment of vertebral bodies is noted. The end of the left-sided intrapedicular screw of L5 appears to extend minimally beyond the superior endplate of L5. IMPRESSION: Surgical posterior fusion as described above. The end of the left-sided intrapedicular screw of L5 does appear to extend minimally beyond the superior endplate of L5. Electronically Signed   By: Lupita RaiderJames  Green Jr, M.D.   On: 05/09/2017 12:59   Dg C-arm 61-120 Min  Result Date: 05/09/2017 CLINICAL DATA:  Status post surgical posterior fusion of L5-S1. EXAM: DG C-ARM 61-120 MIN; LUMBAR SPINE - COMPLETE 4+ VIEW FLUOROSCOPY TIME:  1 minutes 12 seconds. COMPARISON:  MRI of January 03, 2017. FINDINGS: Four intraoperative fluoroscopic images of the lower lumbar spine demonstrate the patient be status post surgical posterior fusion of L5-S1 with bilateral intrapedicular screw placement. Good alignment of vertebral bodies is noted. The end of the left-sided intrapedicular screw of L5 appears to extend minimally beyond the superior endplate of L5. IMPRESSION: Surgical posterior fusion as described above. The end of the left-sided intrapedicular screw of L5 does appear to extend minimally beyond the superior endplate of L5. Electronically Signed   By: Lupita RaiderJames  Green Jr, M.D.   On: 05/09/2017 12:59   Xr  Lumbar Spine 2-3 Views  Result Date: 05/20/2017 AP and lateral of the lumbar spine with pedicle screws and rods fixing the L5-S1 level with cage at the intervertebral disc level and additional bone graft. No abnormalities of the hardware seen.    Discharge Instructions    Call MD / Call 911    Complete by:  As directed    If you experience chest pain or shortness of breath, CALL 911 and be transported to the hospital emergency room.  If you develope a fever above 101 F, pus (white drainage) or increased drainage or redness at the wound, or calf pain, call your surgeon's office.   Constipation Prevention    Complete by:  As directed    Drink plenty of fluids.  Prune juice may be helpful.  You may use a stool softener, such as Colace (over the counter) 100 mg twice a day.  Use MiraLax (over the counter) for constipation as needed.   Diet - low sodium heart healthy    Complete by:  As directed    Discharge instructions  Complete by:  As directed    Call if there is increasing drainage, fever greater than 101.5, severe head aches, and worsening nausea or light sensitivity. If shortness of breath, bloody cough or chest tightness or pain go to an emergency room. No lifting greater than 10 lbs. Avoid bending, stooping and twisting. Use brace when sitting and out of bed even to go to bathroom. Walk in house for first 2 weeks then may start to get out slowly increasing distances up to one mile by 4-6 weeks post op. After 5 days may shower and change dressing following bathing with shower.When bathing remove the brace shower and replace brace before getting out of the shower. If drainage, keep dry dressing and do not bathe the incision, use an moisture impervious dressing. Please call and return for scheduled follow up appointment 2 weeks from the time of surgery.   Driving restrictions    Complete by:  As directed    No driving due to seizure disorder.   Increase activity slowly as tolerated     Complete by:  As directed    Lifting restrictions    Complete by:  As directed    No lifting for 8 weeks      Follow-up Information    [name], MD Follow up in 2 week(s).   Specialty:  Orthopedic Surgery Why:  For wound re-check Contact information: [address] [place] [address] [phone]        [facility] - Dme Follow up.   Why:  3in1 to be delivered to room prior to DC. Contact information: [address] [place] [address] [phone]        [facility]Home Follow up.   Why:  For home health PT Contact information: [address] [place] [address] [phone]           Discharge Plan:  discharge to home  Disposition:     Signed: Zonia Kief 06/02/2017, 5:03 AM

## 2017-06-06 DIAGNOSIS — T814XXA Infection following a procedure, initial encounter: Secondary | ICD-10-CM | POA: Diagnosis not present

## 2017-06-06 DIAGNOSIS — Z981 Arthrodesis status: Secondary | ICD-10-CM | POA: Diagnosis not present

## 2017-06-07 ENCOUNTER — Ambulatory Visit
Admission: RE | Admit: 2017-06-07 | Discharge: 2017-06-07 | Disposition: A | Discharge status: Home / Self Care | Payer: Medicare Other | Source: Ambulatory Visit | Attending: Specialist | Admitting: Specialist

## 2017-06-07 DIAGNOSIS — M25551 Pain in right hip: Secondary | ICD-10-CM | POA: Diagnosis not present

## 2017-06-07 DIAGNOSIS — M2170 Unequal limb length (acquired), unspecified site: Secondary | ICD-10-CM

## 2017-06-07 DIAGNOSIS — M25552 Pain in left hip: Secondary | ICD-10-CM | POA: Diagnosis not present

## 2017-06-08 ENCOUNTER — Other Ambulatory Visit (INDEPENDENT_AMBULATORY_CARE_PROVIDER_SITE_OTHER): Payer: Self-pay | Admitting: Specialist

## 2017-06-08 DIAGNOSIS — M2170 Unequal limb length (acquired), unspecified site: Secondary | ICD-10-CM

## 2017-06-09 ENCOUNTER — Ambulatory Visit (INDEPENDENT_AMBULATORY_CARE_PROVIDER_SITE_OTHER): Payer: Medicare Other | Admitting: Specialist

## 2017-06-09 ENCOUNTER — Encounter (INDEPENDENT_AMBULATORY_CARE_PROVIDER_SITE_OTHER): Payer: Self-pay | Admitting: Specialist

## 2017-06-09 VITALS — BP 149/85 | HR 104 | Temp 99.5°F | Ht 68.0 in | Wt 142.0 lb

## 2017-06-09 DIAGNOSIS — M4326 Fusion of spine, lumbar region: Secondary | ICD-10-CM

## 2017-06-09 DIAGNOSIS — IMO0001 Reserved for inherently not codable concepts without codable children: Secondary | ICD-10-CM

## 2017-06-09 DIAGNOSIS — T814XXA Infection following a procedure, initial encounter: Secondary | ICD-10-CM

## 2017-06-09 LAB — CBC WITH DIFFERENTIAL/PLATELET
BASOS PCT: 0 %
Basophils Absolute: 0 cells/uL (ref 0–200)
Eosinophils Absolute: 100 cells/uL (ref 15–500)
Eosinophils Relative: 1 %
HCT: 36.3 % — ABNORMAL LOW (ref 38.5–50.0)
Hemoglobin: 11.9 g/dL — ABNORMAL LOW (ref 13.2–17.1)
LYMPHS PCT: 14 %
Lymphs Abs: 1400 cells/uL (ref 850–3900)
MCH: 31.5 pg (ref 27.0–33.0)
MCHC: 32.8 g/dL (ref 32.0–36.0)
MCV: 96 fL (ref 80.0–100.0)
MONOS PCT: 9 %
MPV: 8.3 fL (ref 7.5–12.5)
Monocytes Absolute: 900 cells/uL (ref 200–950)
Neutro Abs: 7600 cells/uL (ref 1500–7800)
Neutrophils Relative %: 76 %
PLATELETS: 301 10*3/uL (ref 140–400)
RBC: 3.78 MIL/uL — AB (ref 4.20–5.80)
RDW: 14.3 % (ref 11.0–15.0)
WBC: 10 10*3/uL (ref 3.8–10.8)

## 2017-06-09 MED ORDER — CEPHALEXIN 500 MG PO CAPS: 500.0000 mg | ORAL_CAPSULE | Freq: Four times a day (QID) | ORAL | 0 refills | Status: DC

## 2017-06-09 MED ORDER — OXYCODONE-ACETAMINOPHEN 10-325 MG PO TABS: 1.0000 | ORAL_TABLET | ORAL | 0 refills | Status: DC | PRN

## 2017-06-09 NOTE — Progress Notes (Signed)
   Post-Op Visit Note   Patient: [name]           Date of Birth: [birth year]           MRN: [id number] Visit Date: 06/09/2017 PCP: Simone CuriaLee, Keung, MD   Assessment & Plan: $ weeks ppost TLIF with drainage and a suture abscess at the superior incision site,  Culture obtained to  Chief Complaint:  Chief Complaint  Patient presents with  . Lower Back - Routine Post Op   Visit Diagnoses:  1. Superficial incisional infection of surgical site, initial encounter   2. Fusion of spine of lumbar region   4 weeks post op complaining of increasing incisional pain and drainage. The incision has a localized area of erythrema worse at the Superior aspect about 1.5 inches in diameter with a area of central white subcutaneous tissue necrosis, The area explored and a superficial Stitch abscess is present. The area cultured and a 1/4 inch gauze packing placed. Start keflex 500 mg q 6 hours, renew narcotics, Obtain laboratory CBC with diff, sed rate, CRP and return for follow up in the AM.   Plan: Avoid frequent bending and stooping  No lifting greater than 10 lbs. May use ice or moist heat for pain. Weight loss is of benefit. Keep dressing dry. Will call with the results of the laboratory tests, CBC with diferential, Sed rate, CRP.  Culture initial result may be available by tomorrow. Return appointment tomorrow for assessment.   Follow-Up Instructions: Return in about 1 day (around 06/10/2017).   Orders:  No orders of the defined types were placed in this encounter.  No orders of the defined types were placed in this encounter.   Imaging: No results found.  PMFS History: Patient Active Problem List   Diagnosis Date Noted  . Spinal stenosis of lumbar region 05/09/2017    Priority: High    Class: Chronic  . Localization-related idiopathic epilepsy and epileptic syndromes with seizures of localized onset, not intractable, with status epilepticus (HCC) 03/31/2017  . Medication  monitoring encounter 03/31/2017  . Drug induced subacute dyskinesia 03/01/2017  . Seizure disorder (HCC) 03/01/2017  . Spinal stenosis of lumbar region with neurogenic claudication 03/01/2017  . Sciatica of left side 03/01/2017  . 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 Acute left-sided low back pain with left-sided sciatica 12/02/2016  . Avascular necrosis of hip (HCC) 02/05/2015   Past Medical History:  Diagnosis Date  . Depression   . GERD (gastroesophageal reflux disease)    OTC meds  . Headache   . History of kidney stones   . Hypertension   . Seizures (HCC)    several months since the last surgery    No family history on file.  Past Surgical History:  Procedure Laterality Date  . JOINT REPLACEMENT Bilateral    hip right and left  . ROTATOR CUFF REPAIR Left   . TOTAL HIP ARTHROPLASTY Left 02/05/2015   Procedure: TOTAL HIP ARTHROPLASTY;  Surgeon: Nadara MustardMarcus Duda V, MD;  Location: [facility] OR;  Service: Orthopedics;  Laterality: Left;   Social History   Occupational History  . Not on file.   Social History Main Topics  . Smoking status: Never Smoker  . Smokeless tobacco: Never Used  . Alcohol use 2.4 oz/week    4 Cans of beer per week  . Drug use: No  . Sexual activity: Not on file

## 2017-06-09 NOTE — Patient Instructions (Addendum)
Avoid frequent bending and stooping  No lifting greater than 10 lbs. May use ice or moist heat for pain. Weight loss is of benefit. Keep dressing dry. Will call with the results of the laboratory tests, CBC with diferential, Sed rate, CRP.  Culture initial result may be available by tomorrow. Return appointment tomorrow for assessment.

## 2017-06-09 NOTE — Addendum Note (Signed)
Addended by: Vira BrownsNITKA, Jasiah Buntin on: 06/09/2017 03:17 PM   Modules accepted: Orders

## 2017-06-10 ENCOUNTER — Ambulatory Visit (INDEPENDENT_AMBULATORY_CARE_PROVIDER_SITE_OTHER): Payer: Medicare Other | Admitting: Specialist

## 2017-06-10 ENCOUNTER — Encounter (INDEPENDENT_AMBULATORY_CARE_PROVIDER_SITE_OTHER): Payer: Self-pay | Admitting: Specialist

## 2017-06-10 ENCOUNTER — Inpatient Hospital Stay (HOSPITAL_COMMUNITY)
Admission: AD | Admit: 2017-06-10 | Discharge: 2017-06-15 | DRG: 858 | Disposition: A | Discharge status: Home Health | Payer: Medicare Other | Source: Ambulatory Visit | Attending: Specialist | Admitting: Specialist

## 2017-06-10 VITALS — BP 125/77 | HR 94 | Temp 100.6°F | Ht 68.0 in | Wt 152.0 lb

## 2017-06-10 DIAGNOSIS — L7634 Postprocedural seroma of skin and subcutaneous tissue following other procedure: Secondary | ICD-10-CM | POA: Diagnosis not present

## 2017-06-10 DIAGNOSIS — E8809 Other disorders of plasma-protein metabolism, not elsewhere classified: Secondary | ICD-10-CM | POA: Diagnosis present

## 2017-06-10 DIAGNOSIS — R569 Unspecified convulsions: Secondary | ICD-10-CM | POA: Diagnosis present

## 2017-06-10 DIAGNOSIS — Z79899 Other long term (current) drug therapy: Secondary | ICD-10-CM | POA: Diagnosis not present

## 2017-06-10 DIAGNOSIS — Z96643 Presence of artificial hip joint, bilateral: Secondary | ICD-10-CM | POA: Diagnosis present

## 2017-06-10 DIAGNOSIS — F329 Major depressive disorder, single episode, unspecified: Secondary | ICD-10-CM | POA: Diagnosis present

## 2017-06-10 DIAGNOSIS — A4901 Methicillin susceptible Staphylococcus aureus infection, unspecified site: Secondary | ICD-10-CM | POA: Diagnosis present

## 2017-06-10 DIAGNOSIS — I10 Essential (primary) hypertension: Secondary | ICD-10-CM | POA: Diagnosis present

## 2017-06-10 DIAGNOSIS — IMO0001 Reserved for inherently not codable concepts without codable children: Secondary | ICD-10-CM

## 2017-06-10 DIAGNOSIS — T814XXA Infection following a procedure, initial encounter: Secondary | ICD-10-CM | POA: Diagnosis not present

## 2017-06-10 DIAGNOSIS — Z79891 Long term (current) use of opiate analgesic: Secondary | ICD-10-CM | POA: Diagnosis not present

## 2017-06-10 DIAGNOSIS — Z87442 Personal history of urinary calculi: Secondary | ICD-10-CM | POA: Diagnosis not present

## 2017-06-10 DIAGNOSIS — B9561 Methicillin susceptible Staphylococcus aureus infection as the cause of diseases classified elsewhere: Secondary | ICD-10-CM | POA: Diagnosis not present

## 2017-06-10 DIAGNOSIS — K219 Gastro-esophageal reflux disease without esophagitis: Secondary | ICD-10-CM | POA: Diagnosis present

## 2017-06-10 DIAGNOSIS — T8142XA Infection following a procedure, deep incisional surgical site, initial encounter: Secondary | ICD-10-CM

## 2017-06-10 DIAGNOSIS — Z981 Arthrodesis status: Secondary | ICD-10-CM

## 2017-06-10 DIAGNOSIS — G40909 Epilepsy, unspecified, not intractable, without status epilepticus: Secondary | ICD-10-CM | POA: Diagnosis not present

## 2017-06-10 DIAGNOSIS — Y838 Other surgical procedures as the cause of abnormal reaction of the patient, or of later complication, without mention of misadventure at the time of the procedure: Secondary | ICD-10-CM | POA: Diagnosis not present

## 2017-06-10 DIAGNOSIS — M87059 Idiopathic aseptic necrosis of unspecified femur: Secondary | ICD-10-CM | POA: Diagnosis not present

## 2017-06-10 LAB — COMPREHENSIVE METABOLIC PANEL
ALK PHOS: 94 U/L (ref 38–126)
ALT: 11 U/L — AB (ref 17–63)
AST: 14 U/L — AB (ref 15–41)
Albumin: 2.9 g/dL — ABNORMAL LOW (ref 3.5–5.0)
Anion gap: 7 (ref 5–15)
BUN: 12 mg/dL (ref 6–20)
CALCIUM: 8.8 mg/dL — AB (ref 8.9–10.3)
CHLORIDE: 98 mmol/L — AB (ref 101–111)
CO2: 26 mmol/L (ref 22–32)
CREATININE: 0.98 mg/dL (ref 0.61–1.24)
Glucose, Bld: 118 mg/dL — ABNORMAL HIGH (ref 65–99)
Potassium: 5.1 mmol/L (ref 3.5–5.1)
Sodium: 131 mmol/L — ABNORMAL LOW (ref 135–145)
TOTAL PROTEIN: 6.3 g/dL — AB (ref 6.5–8.1)
Total Bilirubin: 0.5 mg/dL (ref 0.3–1.2)

## 2017-06-10 LAB — MRSA PCR SCREENING: MRSA by PCR: NEGATIVE

## 2017-06-10 LAB — CBC WITH DIFFERENTIAL/PLATELET
BASOS ABS: 0 10*3/uL (ref 0.0–0.1)
Basophils Relative: 0 %
Eosinophils Absolute: 0.2 10*3/uL (ref 0.0–0.7)
Eosinophils Relative: 2 %
HEMATOCRIT: 32.9 % — AB (ref 39.0–52.0)
HEMOGLOBIN: 10.9 g/dL — AB (ref 13.0–17.0)
LYMPHS PCT: 12 %
Lymphs Abs: 1.3 10*3/uL (ref 0.7–4.0)
MCH: 32.2 pg (ref 26.0–34.0)
MCHC: 33.1 g/dL (ref 30.0–36.0)
MCV: 97.1 fL (ref 78.0–100.0)
Monocytes Absolute: 0.9 10*3/uL (ref 0.1–1.0)
Monocytes Relative: 8 %
NEUTROS ABS: 8.6 10*3/uL — AB (ref 1.7–7.7)
Neutrophils Relative %: 78 %
Platelets: 240 10*3/uL (ref 150–400)
RBC: 3.39 MIL/uL — AB (ref 4.22–5.81)
RDW: 13.9 % (ref 11.5–15.5)
WBC: 11 10*3/uL — AB (ref 4.0–10.5)

## 2017-06-10 LAB — C-REACTIVE PROTEIN: CRP: 113.2 mg/L — AB (ref <8.0)

## 2017-06-10 LAB — PROTIME-INR
INR: 1.17
PROTHROMBIN TIME: 14.9 s (ref 11.4–15.2)

## 2017-06-10 LAB — APTT: aPTT: 37 seconds — ABNORMAL HIGH (ref 24–36)

## 2017-06-10 LAB — SEDIMENTATION RATE: Sed Rate: 63 mm/hr — ABNORMAL HIGH (ref 0–20)

## 2017-06-10 MED ORDER — ONDANSETRON HCL 4 MG/2ML IJ SOLN: 4.0000 mg | Freq: Four times a day (QID) | INTRAMUSCULAR | Status: DC | PRN

## 2017-06-10 MED ORDER — CELECOXIB 200 MG PO CAPS
200.0000 mg | ORAL_CAPSULE | Freq: Two times a day (BID) | ORAL | Status: DC
  Administered 2017-06-10 – 2017-06-15 (×10): 200 mg via ORAL
  Filled 2017-06-10 (×10): qty 1

## 2017-06-10 MED ORDER — ACETAMINOPHEN 325 MG PO TABS
650.0000 mg | ORAL_TABLET | Freq: Four times a day (QID) | ORAL | Status: DC | PRN
Start: 2017-06-10 — End: 2017-06-15
  Administered 2017-06-10 – 2017-06-14 (×9): 650 mg via ORAL
  Filled 2017-06-10 (×9): qty 2

## 2017-06-10 MED ORDER — POVIDONE-IODINE 10 % EX SWAB: 2.0000 "application " | Freq: Once | CUTANEOUS | Status: DC

## 2017-06-10 MED ORDER — OXYCODONE HCL 5 MG PO TABS
5.0000 mg | ORAL_TABLET | ORAL | Status: DC | PRN
Start: 2017-06-10 — End: 2017-06-15
  Administered 2017-06-10: 5 mg via ORAL
  Administered 2017-06-10 – 2017-06-12 (×8): 10 mg via ORAL
  Administered 2017-06-12 (×2): 5 mg via ORAL
  Administered 2017-06-12 – 2017-06-13 (×2): 10 mg via ORAL
  Administered 2017-06-13: 5 mg via ORAL
  Administered 2017-06-13 – 2017-06-15 (×11): 10 mg via ORAL
  Filled 2017-06-10 (×2): qty 1
  Filled 2017-06-10 (×3): qty 2
  Filled 2017-06-10: qty 1
  Filled 2017-06-10 (×16): qty 2
  Filled 2017-06-10: qty 1
  Filled 2017-06-10 (×2): qty 2

## 2017-06-10 MED ORDER — SODIUM CHLORIDE 0.9 % IV SOLN
INTRAVENOUS | Status: DC
  Administered 2017-06-10 – 2017-06-13 (×2): via INTRAVENOUS

## 2017-06-10 MED ORDER — CHLORHEXIDINE GLUCONATE 4 % EX LIQD: 60.0000 mL | Freq: Once | CUTANEOUS | Status: DC

## 2017-06-10 MED ORDER — ACETAMINOPHEN 650 MG RE SUPP: 650.0000 mg | Freq: Four times a day (QID) | RECTAL | Status: DC | PRN

## 2017-06-10 MED ORDER — METHOCARBAMOL 1000 MG/10ML IJ SOLN
500.0000 mg | Freq: Four times a day (QID) | INTRAVENOUS | Status: DC | PRN
  Filled 2017-06-10: qty 5

## 2017-06-10 MED ORDER — DOCUSATE SODIUM 100 MG PO CAPS
100.0000 mg | ORAL_CAPSULE | Freq: Two times a day (BID) | ORAL | Status: DC
  Administered 2017-06-10 – 2017-06-15 (×10): 100 mg via ORAL
  Filled 2017-06-10 (×10): qty 1

## 2017-06-10 MED ORDER — ONDANSETRON HCL 4 MG PO TABS: 4.0000 mg | ORAL_TABLET | Freq: Four times a day (QID) | ORAL | Status: DC | PRN

## 2017-06-10 MED ORDER — VANCOMYCIN HCL 10 G IV SOLR
1250.0000 mg | Freq: Two times a day (BID) | INTRAVENOUS | Status: DC
  Administered 2017-06-10 – 2017-06-12 (×4): 1250 mg via INTRAVENOUS
  Filled 2017-06-10 (×5): qty 1250

## 2017-06-10 MED ORDER — METHOCARBAMOL 500 MG PO TABS
500.0000 mg | ORAL_TABLET | Freq: Four times a day (QID) | ORAL | Status: DC | PRN
  Administered 2017-06-10 – 2017-06-15 (×13): 500 mg via ORAL
  Filled 2017-06-10 (×13): qty 1

## 2017-06-10 MED ORDER — PIPERACILLIN-TAZOBACTAM 3.375 G IVPB
3.3750 g | Freq: Three times a day (TID) | INTRAVENOUS | Status: DC
  Administered 2017-06-10 – 2017-06-11 (×2): 3.375 g via INTRAVENOUS
  Filled 2017-06-10 (×4): qty 50

## 2017-06-10 MED ORDER — BISACODYL 5 MG PO TBEC: 5.0000 mg | DELAYED_RELEASE_TABLET | Freq: Every day | ORAL | Status: DC | PRN

## 2017-06-10 MED ORDER — POLYETHYLENE GLYCOL 3350 17 G PO PACK: 17.0000 g | PACK | Freq: Every day | ORAL | Status: DC | PRN

## 2017-06-10 MED ORDER — FLEET ENEMA 7-19 GM/118ML RE ENEM: 1.0000 | ENEMA | Freq: Once | RECTAL | Status: DC | PRN

## 2017-06-10 MED ORDER — GABAPENTIN 300 MG PO CAPS
300.0000 mg | ORAL_CAPSULE | Freq: Once | ORAL | Status: AC
  Administered 2017-06-10: 300 mg via ORAL
  Filled 2017-06-10: qty 1

## 2017-06-10 MED ORDER — MORPHINE SULFATE (PF) 4 MG/ML IV SOLN
1.0000 mg | INTRAVENOUS | Status: DC | PRN
  Administered 2017-06-14 – 2017-06-15 (×4): 1 mg via INTRAVENOUS
  Filled 2017-06-10 (×4): qty 1

## 2017-06-10 NOTE — Patient Instructions (Signed)
[facility], place IV and give IV antibiotics, plan to perform Incision drainage and debridement of the lumbar incision infection in the AM with a probable wound VAC application.

## 2017-06-10 NOTE — H&P (Signed)
PREOPERATIVE H&P  Chief Complaint: lumbar surgical site infection post fusion  HPI: [name] is a 61 y.o. male who presents for preoperative history and physical with a diagnosis of lumbar surgical site infection post fusion.61 year old male 4 1/2 weeks post L5-S1 fusion for spinal stenosis and foramenal entrapment. Post operatively he did well with mild erythrema of the incision site, no fever or chills then began having drainage from the surgical incision site 5 days ago. He has had an occasional chill, but many increasing pain requiring more narcotic pain meds when pain should be improving. Seen yesterday in the office with areas of skin erythrema and openings at the lumbar incision site superiorly and over the lower edges of the incision site. Sutures were debrided and cultures obtained, probing the upper incision a probe extended down to the muscle fascia layer. Results of laboratory test show sed rate 63, WBC  10.0K on CBC. CRP 120. His temperature in the office yesterday was 99.5 today it is  100.6 and drainage yesterday was serosanginious, today it is purulent. New dressing applied.  Symptoms are rated as moderate to severe, and have been worsening.  This is significantly impairing activities of daily living.  He has elected for surgical management.   Past Medical History:  Diagnosis Date  . Depression   . GERD (gastroesophageal reflux disease)    OTC meds  . Headache   . History of kidney stones   . Hypertension   . Seizures (HCC)    several months since the last surgery   Past Surgical History:  Procedure Laterality Date  . JOINT REPLACEMENT Bilateral    hip right and left  . ROTATOR CUFF REPAIR Left   . TOTAL HIP ARTHROPLASTY Left 02/05/2015   Procedure: TOTAL HIP ARTHROPLASTY;  Surgeon: Nadara MustardMarcus Duda V, MD;  Location: [facility] OR;  Service: Orthopedics;  Laterality: Left;   Social History   Social History  . Marital status: Divorced    Spouse name: N/A  . Number of  children: N/A  . Years of education: N/A   Social History Main Topics  . Smoking status: Never Smoker  . Smokeless tobacco: Never Used  . Alcohol use 2.4 oz/week    4 Cans of beer per week  . Drug use: No  . Sexual activity: Not on file   Other Topics Concern  . Not on file   Social History Narrative  . No narrative on file   No family history on file. Allergies  Allergen Reactions  . No Known Allergies    Prior to Admission medications   Medication Sig Start Date End Date Taking? Authorizing Provider  amLODipine (NORVASC) 5 MG tablet Take 5 mg by mouth daily. 10/07/16   [provider]  cephALEXin (KEFLEX) 500 MG capsule Take 1 capsule (500 mg total) by mouth 4 (four) times daily. 06/09/17   Kerrin ChampagneNitka, James E, MD  cimetidine (TAGAMET) 200 MG tablet Take 200 mg by mouth daily.    [provider]  citalopram (CELEXA) 20 MG tablet Take 20 mg by mouth daily.    [provider]  Lacosamide 150 MG TABS Take 1 tablet (150 mg total) by mouth 2 (two) times daily. 03/31/17   Dohmeier, Porfirio Mylararmen, MD  levETIRAcetam (KEPPRA) 750 MG tablet Take 750 mg by mouth 2 (two) times daily.    [provider]  lisinopril (PRINIVIL,ZESTRIL) 40 MG tablet Take 40 mg by mouth daily.    [provider]  methocarbamol (ROBAXIN) 500  MG tablet Take 1 tablet (500 mg total) by mouth every 6 (six) hours as needed for muscle spasms. 05/11/17   Naida Sleight, PA-C  mupirocin cream (BACTROBAN) 2 % Apply 1 application topically 2 (two) times daily. 05/20/17   [name], MD  oxyCODONE-acetaminophen (PERCOCET) 10-325 MG tablet Take 1 tablet by mouth every 4 (four) hours as needed for pain. MAXIMUM TOTAL ACETAMINOPHEN DOSE IS 4000 MG PER DAY 06/09/17   [name], MD  oxyCODONE-acetaminophen (PERCOCET) 7.5-325 MG tablet Take 1 tablet by mouth every 6 (six) hours as needed for severe pain. 06/01/17   [name], MD  SUBOXONE 8-2 MG FILM Take 8 mg by mouth 2 (two) times daily.   11/01/16   [provider]     Positive ROS: All other systems have been reviewed and were otherwise negative with the exception of those mentioned in the HPI and as above.  Physical Exam: General: Alert, no acute distress Cardiovascular: No pedal edema Respiratory: No cyanosis, no use of accessory musculature GI: No organomegaly, abdomen is soft and non-tender Skin: No lesions in the area of chief complaint Neurologic: Sensation intact distally Psychiatric: Patient is competent for consent with normal mood and affect Lymphatic: No axillary or cervical lymphadenopathy  MUSCULOSKELETAL: Back Exam   Tenderness  The patient is experiencing tenderness in the lumbar.  Range of Motion  Extension: normal  Flexion: normal  Lateral Bend Right: normal  Lateral Bend Left: normal  Rotation Right: normal  Rotation Left: normal   Muscle Strength  Right Quadriceps:  5/5  Left Quadriceps:  5/5  Right Hamstrings:  5/5  Left Hamstrings:  5/5   Tests  Straight leg raise right: negative Straight leg raise left: negative  Reflexes  Patellar: normal Achilles: normal Babinski's sign: normal   Other  Toe Walk: normal Heel Walk: normal Sensation: normal Gait: normal   Comments:  Open incision site superior lumbar incision with purulent drainage. Surrounding erythrema and yellow gray creamy exudate suggestive of staph.   Sed rate 63 CRP 113.2 CBC WBC 10.0 HCT 36.3 Hgb 11.9 Platelet 301. Assessment: lumbar surgical site infection post fusion Recent lumbar fusion 05/09/2017  Plan: Plan for Procedure(s): Incision, Drainage and Debridement of lumbar surgical incision site, possible vac placement  The risks benefits and alternatives were discussed with the patient including but not limited to the risks of nonoperative treatment, versus surgical intervention including infection, bleeding, nerve injury,  blood clots, cardiopulmonary complications, morbidity, mortality,  among others, and they were willing to proceed.   [name], MD Cell [phone] Office [phone] 06/10/2017 3:52 PM

## 2017-06-10 NOTE — Progress Notes (Signed)
Pharmacy Antibiotic Note  [name] is a 61 y.o. male admitted on 06/10/2017 with lumbar wound infection.  Pharmacy has been consulted for Vancomycin / Zosyn dosing.  Planning I + D, with possible VAC placement  Plan: Zosyn 3.375 grams iv Q 8 hours (4 hr infusion) Vancomycin 1250 mg iv Q 12 hours Follow up progress, cultures, Scr      Temp (24hrs), Avg:100.9 F (38.3 C), Min:100.6 F (38.1 C), Max:101.2 F (38.4 C)   Recent Labs Lab 06/09/17 1431  WBC 10.0    CrCl cannot be calculated (Patient's most recent lab result is older than the maximum 21 days allowed.).    Allergies  Allergen Reactions  . No Known Allergies     Thank you for allowing pharmacy to be a part of this patient's care. Okey RegalLisa Maxton Noreen, PharmD [phone] 06/10/2017 4:52 PM

## 2017-06-10 NOTE — Progress Notes (Signed)
Office Visit Note   Patient: [name]           Date of Birth: 1956/08/25           MRN: [id number] Visit Date: 06/10/2017              Requested by: Simone Curia, MD [address] [place], [place] [address] PCP: Simone Curia, MD   Assessment & Plan: Visit Diagnoses:  1. Deep incisional surgical site infection, initial encounter   2. Status post lumbar spinal fusion     Plan:Admit to [facility] today, place IV and give IV antibiotics, plan to perform Incision drainage and debridement of the lumbar incision infection in the AM with a probable wound VAC application.  Follow-Up Instructions: No Follow-up on file.   Orders:  No orders of the defined types were placed in this encounter.  No orders of the defined types were placed in this encounter.     Procedures: No procedures performed   Clinical Data: No additional findings.   Subjective: Chief Complaint  Patient presents with  . Lower Back - Follow-up, Wound Check    61 year old male 4 1/2 weeks post L5-S1 fusion for spinal stenosis and foramenal entrapment. Post operatively he did well with mild erythrema of the incision site, no fever or chills then began having drainage from the surgical incision site 5 days ago. He has had an occasional chill, but many increasing pain requiring more narcotic pain meds when pain should be improving. Seen yesterday in the office with areas of skin erythrema and openings at the lumbar incision site superiorly and over the lower edges of the incision site. Sutures were debrided and cultures obtained, probing the upper incision a probe extended down to the muscle fascia layer. Results of laboratory test show sed rate 63, WBC  10.0K on CBC. CRP 120. His temperature in the office yesterday was 99.5 today it is  100.6 and drainage yesterday was serosanginious, today it is purulent. New dressing applied.    Review of Systems  Constitutional: Negative.   HENT: Negative.   Eyes:  Negative.   Respiratory: Negative.   Cardiovascular: Negative.   Gastrointestinal: Negative.   Endocrine: Negative.   Genitourinary: Negative.   Musculoskeletal: Negative.   Skin: Negative.   Allergic/Immunologic: Negative.   Neurological: Negative.   Hematological: Negative.   Psychiatric/Behavioral: Negative.      Objective: Vital Signs: BP 125/77 (BP Location: Left Arm, Patient Position: Sitting)   Pulse 94   Temp (!) 100.6 F (38.1 C) (Oral)   Ht 5\' 8"  (1.727 m)   Wt 152 lb (68.9 kg)   BMI 23.11 kg/m   Physical Exam  Constitutional: He is oriented to person, place, and time. He appears well-developed and well-nourished.  HENT:  Head: Normocephalic and atraumatic.  Eyes: EOM are normal. Pupils are equal, round, and reactive to light.  Neck: Normal range of motion. Neck supple.  Pulmonary/Chest: Effort normal and breath sounds normal.  Abdominal: Soft. Bowel sounds are normal.  Musculoskeletal: Normal range of motion.  Neurological: He is alert and oriented to person, place, and time.  Skin: Skin is warm and dry.  Psychiatric: He has a normal mood and affect. His behavior is normal. Judgment and thought content normal.    Back Exam   Tenderness  The patient is experiencing tenderness in the lumbar.  Range of Motion  Extension: normal  Flexion: normal  Lateral Bend Right: normal  Lateral Bend Left:  normal  Rotation Right: normal  Rotation Left: normal   Muscle Strength  Right Quadriceps:  5/5  Left Quadriceps:  5/5  Right Hamstrings:  5/5  Left Hamstrings:  5/5   Tests  Straight leg raise right: negative Straight leg raise left: negative  Reflexes  Patellar: normal Achilles: normal Babinski's sign: normal   Other  Toe Walk: normal Heel Walk: normal Sensation: normal Gait: normal   Comments:  Open incision site superior lumbar incision with purulent drainage. Surrounding erythrema and yellow gray creamy exudate suggestive of staph.        Specialty Comments:  No specialty comments available.  Imaging: No results found.   PMFS History: Patient Active Problem List   Diagnosis Date Noted  . Spinal stenosis of lumbar region 05/09/2017    Priority: High    Class: Chronic  . Localization-related idiopathic epilepsy and epileptic syndromes with seizures of localized onset, not intractable, with status epilepticus (HCC) 03/31/2017  . Medication monitoring encounter 03/31/2017  . Drug induced subacute dyskinesia 03/01/2017  . Seizure disorder (HCC) 03/01/2017  . Spinal stenosis of lumbar region with neurogenic claudication 03/01/2017  . Sciatica of left side 03/01/2017  . 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 Acute left-sided low back pain with left-sided sciatica 12/02/2016  . Avascular necrosis of hip (HCC) 02/05/2015   Past Medical History:  Diagnosis Date  . Depression   . GERD (gastroesophageal reflux disease)    OTC meds  . Headache   . History of kidney stones   . Hypertension   . Seizures (HCC)    several months since the last surgery    No family history on file.  Past Surgical History:  Procedure Laterality Date  . JOINT REPLACEMENT Bilateral    hip right and left  . ROTATOR CUFF REPAIR Left   . TOTAL HIP ARTHROPLASTY Left 02/05/2015   Procedure: TOTAL HIP ARTHROPLASTY;  Surgeon: Nadara MustardMarcus Duda V, MD;  Location: [facility] OR;  Service: Orthopedics;  Laterality: Left;   Social History   Occupational History  . Not on file.   Social History Main Topics  . Smoking status: Never Smoker  . Smokeless tobacco: Never Used  . Alcohol use 2.4 oz/week    4 Cans of beer per week  . Drug use: No  . Sexual activity: Not on file

## 2017-06-11 ENCOUNTER — Inpatient Hospital Stay (HOSPITAL_COMMUNITY): Payer: Medicare Other | Admitting: Anesthesiology

## 2017-06-11 ENCOUNTER — Encounter (HOSPITAL_COMMUNITY)
Admission: AD | Disposition: A | Discharge status: Home Health | Payer: Self-pay | Source: Ambulatory Visit | Attending: Specialist

## 2017-06-11 DIAGNOSIS — L7634 Postprocedural seroma of skin and subcutaneous tissue following other procedure: Secondary | ICD-10-CM

## 2017-06-11 DIAGNOSIS — Z981 Arthrodesis status: Secondary | ICD-10-CM

## 2017-06-11 DIAGNOSIS — Y838 Other surgical procedures as the cause of abnormal reaction of the patient, or of later complication, without mention of misadventure at the time of the procedure: Secondary | ICD-10-CM

## 2017-06-11 DIAGNOSIS — T814XXA Infection following a procedure, initial encounter: Principal | ICD-10-CM

## 2017-06-11 HISTORY — PX: LUMBAR LAMINECTOMY: SHX95

## 2017-06-11 LAB — URINALYSIS, COMPLETE (UACMP) WITH MICROSCOPIC
BACTERIA UA: NONE SEEN
Bilirubin Urine: NEGATIVE
Glucose, UA: NEGATIVE mg/dL
Hgb urine dipstick: NEGATIVE
KETONES UR: NEGATIVE mg/dL
LEUKOCYTES UA: NEGATIVE
Nitrite: NEGATIVE
PROTEIN: NEGATIVE mg/dL
RBC / HPF: NONE SEEN RBC/hpf (ref 0–5)
Specific Gravity, Urine: 1.03 (ref 1.005–1.030)
pH: 5 (ref 5.0–8.0)

## 2017-06-11 LAB — CBC WITH DIFFERENTIAL/PLATELET
BASOS ABS: 0 10*3/uL (ref 0.0–0.1)
Basophils Relative: 0 %
EOS ABS: 0.2 10*3/uL (ref 0.0–0.7)
EOS PCT: 2 %
HCT: 30.9 % — ABNORMAL LOW (ref 39.0–52.0)
HEMOGLOBIN: 9.9 g/dL — AB (ref 13.0–17.0)
LYMPHS ABS: 1 10*3/uL (ref 0.7–4.0)
LYMPHS PCT: 11 %
MCH: 31.3 pg (ref 26.0–34.0)
MCHC: 32 g/dL (ref 30.0–36.0)
MCV: 97.8 fL (ref 78.0–100.0)
Monocytes Absolute: 0.4 10*3/uL (ref 0.1–1.0)
Monocytes Relative: 5 %
NEUTROS PCT: 82 %
Neutro Abs: 7.8 10*3/uL — ABNORMAL HIGH (ref 1.7–7.7)
PLATELETS: 256 10*3/uL (ref 150–400)
RBC: 3.16 MIL/uL — AB (ref 4.22–5.81)
RDW: 14.1 % (ref 11.5–15.5)
WBC: 9.4 10*3/uL (ref 4.0–10.5)

## 2017-06-11 LAB — HIV ANTIBODY (ROUTINE TESTING W REFLEX): HIV SCREEN 4TH GENERATION: NONREACTIVE

## 2017-06-11 SURGERY — MICRODISCECTOMY LUMBAR LAMINECTOMY
Anesthesia: General | Site: Back

## 2017-06-11 MED ORDER — SODIUM CHLORIDE 0.9 % IR SOLN
Status: DC | PRN
  Administered 2017-06-11: 500 mL

## 2017-06-11 MED ORDER — ROCURONIUM BROMIDE 100 MG/10ML IV SOLN
INTRAVENOUS | Status: DC | PRN
  Administered 2017-06-11: 50 mg via INTRAVENOUS

## 2017-06-11 MED ORDER — CITALOPRAM HYDROBROMIDE 20 MG PO TABS
20.0000 mg | ORAL_TABLET | Freq: Every day | ORAL | Status: DC
  Administered 2017-06-11 – 2017-06-15 (×5): 20 mg via ORAL
  Filled 2017-06-11 (×5): qty 1

## 2017-06-11 MED ORDER — HYDROMORPHONE HCL 1 MG/ML IJ SOLN
INTRAMUSCULAR | Status: AC
  Administered 2017-06-11: 0.5 mg via INTRAVENOUS
  Filled 2017-06-11: qty 2

## 2017-06-11 MED ORDER — ACETAMINOPHEN 10 MG/ML IV SOLN
INTRAVENOUS | Status: AC
  Administered 2017-06-11: 1000 mg via INTRAVENOUS
  Filled 2017-06-11: qty 100

## 2017-06-11 MED ORDER — EPHEDRINE SULFATE 50 MG/ML IJ SOLN
INTRAMUSCULAR | Status: DC | PRN
  Administered 2017-06-11 (×2): 15 mg via INTRAVENOUS
  Administered 2017-06-11: 10 mg via INTRAVENOUS

## 2017-06-11 MED ORDER — SODIUM CHLORIDE 0.9 % IV SOLN
750.0000 mg | INTRAVENOUS | Status: AC
  Administered 2017-06-11: 750 mg via INTRAVENOUS
  Filled 2017-06-11: qty 7.5

## 2017-06-11 MED ORDER — PANTOPRAZOLE SODIUM 40 MG PO TBEC
40.0000 mg | DELAYED_RELEASE_TABLET | Freq: Every day | ORAL | Status: DC
Start: 2017-06-11 — End: 2017-06-15
  Administered 2017-06-11 – 2017-06-15 (×5): 40 mg via ORAL
  Filled 2017-06-11 (×5): qty 1

## 2017-06-11 MED ORDER — PROPOFOL 10 MG/ML IV BOLUS
INTRAVENOUS | Status: DC | PRN
Start: 2017-06-11 — End: 2017-06-11
  Administered 2017-06-11: 120 mg via INTRAVENOUS

## 2017-06-11 MED ORDER — OXYCODONE HCL 5 MG PO TABS: 5.0000 mg | ORAL_TABLET | Freq: Once | ORAL | Status: DC | PRN

## 2017-06-11 MED ORDER — OXYCODONE HCL 5 MG/5ML PO SOLN
5.0000 mg | Freq: Once | ORAL | Status: DC | PRN
Start: 2017-06-11 — End: 2017-06-11

## 2017-06-11 MED ORDER — PROPOFOL 10 MG/ML IV BOLUS
INTRAVENOUS | Status: AC
  Filled 2017-06-11: qty 40

## 2017-06-11 MED ORDER — LEVETIRACETAM 750 MG PO TABS
750.0000 mg | ORAL_TABLET | Freq: Two times a day (BID) | ORAL | Status: DC
  Administered 2017-06-11 – 2017-06-15 (×8): 750 mg via ORAL
  Filled 2017-06-11 (×10): qty 1

## 2017-06-11 MED ORDER — LISINOPRIL 40 MG PO TABS
40.0000 mg | ORAL_TABLET | Freq: Every day | ORAL | Status: DC
  Administered 2017-06-13 – 2017-06-15 (×3): 40 mg via ORAL
  Filled 2017-06-11 (×4): qty 1

## 2017-06-11 MED ORDER — MIDAZOLAM HCL 5 MG/5ML IJ SOLN
INTRAMUSCULAR | Status: DC | PRN
  Administered 2017-06-11: 2 mg via INTRAVENOUS

## 2017-06-11 MED ORDER — HYDROMORPHONE HCL 1 MG/ML IJ SOLN
0.2500 mg | INTRAMUSCULAR | Status: DC | PRN
  Administered 2017-06-11 (×4): 0.5 mg via INTRAVENOUS

## 2017-06-11 MED ORDER — ACETAMINOPHEN 10 MG/ML IV SOLN
1000.0000 mg | Freq: Once | INTRAVENOUS | Status: AC
  Administered 2017-06-11: 1000 mg via INTRAVENOUS

## 2017-06-11 MED ORDER — 0.9 % SODIUM CHLORIDE (POUR BTL) OPTIME
TOPICAL | Status: DC | PRN
  Administered 2017-06-11: 1000 mL

## 2017-06-11 MED ORDER — SUGAMMADEX SODIUM 200 MG/2ML IV SOLN
INTRAVENOUS | Status: DC | PRN
Start: 2017-06-11 — End: 2017-06-11
  Administered 2017-06-11: 280 mg via INTRAVENOUS

## 2017-06-11 MED ORDER — FERROUS SULFATE 325 (65 FE) MG PO TABS
325.0000 mg | ORAL_TABLET | Freq: Three times a day (TID) | ORAL | Status: DC
  Administered 2017-06-11 – 2017-06-15 (×12): 325 mg via ORAL
  Filled 2017-06-11 (×12): qty 1

## 2017-06-11 MED ORDER — FENTANYL CITRATE (PF) 100 MCG/2ML IJ SOLN
INTRAMUSCULAR | Status: DC | PRN
  Administered 2017-06-11 (×2): 50 ug via INTRAVENOUS
  Administered 2017-06-11: 100 ug via INTRAVENOUS

## 2017-06-11 MED ORDER — LACTATED RINGERS IV SOLN
INTRAVENOUS | Status: DC
Start: 2017-06-11 — End: 2017-06-11
  Administered 2017-06-11 (×2): via INTRAVENOUS

## 2017-06-11 MED ORDER — MIDAZOLAM HCL 2 MG/2ML IJ SOLN
INTRAMUSCULAR | Status: AC
  Filled 2017-06-11: qty 2

## 2017-06-11 MED ORDER — AMLODIPINE BESYLATE 5 MG PO TABS
5.0000 mg | ORAL_TABLET | Freq: Every day | ORAL | Status: DC
  Administered 2017-06-13 – 2017-06-15 (×3): 5 mg via ORAL
  Filled 2017-06-11 (×4): qty 1

## 2017-06-11 MED ORDER — ONDANSETRON HCL 4 MG/2ML IJ SOLN: 4.0000 mg | Freq: Once | INTRAMUSCULAR | Status: DC | PRN

## 2017-06-11 MED ORDER — DEXTROSE 5 % IV SOLN
2.0000 g | INTRAVENOUS | Status: DC
  Administered 2017-06-11: 2 g via INTRAVENOUS
  Filled 2017-06-11 (×2): qty 2

## 2017-06-11 MED ORDER — ONDANSETRON HCL 4 MG/2ML IJ SOLN
INTRAMUSCULAR | Status: DC | PRN
  Administered 2017-06-11: 4 mg via INTRAVENOUS

## 2017-06-11 MED ORDER — FENTANYL CITRATE (PF) 250 MCG/5ML IJ SOLN
INTRAMUSCULAR | Status: AC
  Filled 2017-06-11: qty 5

## 2017-06-11 MED ORDER — LACOSAMIDE 50 MG PO TABS
150.0000 mg | ORAL_TABLET | Freq: Two times a day (BID) | ORAL | Status: DC
  Administered 2017-06-11 – 2017-06-15 (×8): 150 mg via ORAL
  Filled 2017-06-11 (×8): qty 3

## 2017-06-11 MED ORDER — LIDOCAINE HCL (CARDIAC) 20 MG/ML IV SOLN
INTRAVENOUS | Status: DC | PRN
  Administered 2017-06-11: 40 mg via INTRAVENOUS

## 2017-06-11 MED ORDER — PHENYLEPHRINE 40 MCG/ML (10ML) SYRINGE FOR IV PUSH (FOR BLOOD PRESSURE SUPPORT)
PREFILLED_SYRINGE | INTRAVENOUS | Status: DC | PRN
Start: 2017-06-11 — End: 2017-06-11
  Administered 2017-06-11 (×3): 120 ug via INTRAVENOUS
  Administered 2017-06-11: 80 ug via INTRAVENOUS

## 2017-06-11 MED ORDER — MIDAZOLAM HCL 2 MG/2ML IJ SOLN
1.0000 mg | Freq: Once | INTRAMUSCULAR | Status: AC
  Administered 2017-06-11: 1 mg via INTRAVENOUS

## 2017-06-11 SURGICAL SUPPLY — 55 items
BENZOIN TINCTURE PRP APPL 2/3 (GAUZE/BANDAGES/DRESSINGS) ×3 IMPLANT
BUR ROUND FLUTED 4 SOFT TCH (BURR) IMPLANT
BUR ROUND FLUTED 4MM SOFT TCH (BURR)
BUR SABER RD CUTTING 3.0 (BURR) ×2 IMPLANT
BUR SABER RD CUTTING 3.0MM (BURR) ×1
CANISTER SUCT 3000ML PPV (MISCELLANEOUS) ×3 IMPLANT
CLOSURE WOUND 1/2 X4 (GAUZE/BANDAGES/DRESSINGS) ×1
COVER MAYO STAND STRL (DRAPES) ×3 IMPLANT
COVER SURGICAL LIGHT HANDLE (MISCELLANEOUS) ×3 IMPLANT
DRAPE C-ARM 42X72 X-RAY (DRAPES) IMPLANT
DRAPE HALF SHEET 40X57 (DRAPES) IMPLANT
DRAPE LAPAROSCOPIC ABDOMINAL (DRAPES) ×3 IMPLANT
DRAPE MICROSCOPE LEICA (MISCELLANEOUS) ×3 IMPLANT
DRAPE SURG 17X23 STRL (DRAPES) ×12 IMPLANT
DRSG MEPILEX BORDER 4X4 (GAUZE/BANDAGES/DRESSINGS) IMPLANT
DRSG MEPILEX BORDER 4X8 (GAUZE/BANDAGES/DRESSINGS) IMPLANT
DRSG VAC ATS SM SENSATRAC (GAUZE/BANDAGES/DRESSINGS) ×3 IMPLANT
DURAPREP 26ML APPLICATOR (WOUND CARE) ×3 IMPLANT
ELECT BLADE 4.0 EZ CLEAN MEGAD (MISCELLANEOUS) ×3
ELECT CAUTERY BLADE 6.4 (BLADE) ×3 IMPLANT
ELECT REM PT RETURN 9FT ADLT (ELECTROSURGICAL) ×3
ELECTRODE BLDE 4.0 EZ CLN MEGD (MISCELLANEOUS) ×1 IMPLANT
ELECTRODE REM PT RTRN 9FT ADLT (ELECTROSURGICAL) ×1 IMPLANT
GLOVE BIOGEL PI IND STRL 8 (GLOVE) ×1 IMPLANT
GLOVE BIOGEL PI INDICATOR 8 (GLOVE) ×2
GLOVE ECLIPSE 8.5 STRL (GLOVE) ×3 IMPLANT
GLOVE ORTHO TXT STRL SZ7.5 (GLOVE) ×3 IMPLANT
GLOVE SURG 8.5 LATEX PF (GLOVE) ×3 IMPLANT
GOWN STRL REUS W/ TWL LRG LVL3 (GOWN DISPOSABLE) ×1 IMPLANT
GOWN STRL REUS W/TWL 2XL LVL3 (GOWN DISPOSABLE) ×6 IMPLANT
GOWN STRL REUS W/TWL LRG LVL3 (GOWN DISPOSABLE) ×2
KIT BASIN OR (CUSTOM PROCEDURE TRAY) ×3 IMPLANT
KIT ROOM TURNOVER OR (KITS) ×3 IMPLANT
NEEDLE SPNL 18GX3.5 QUINCKE PK (NEEDLE) ×6 IMPLANT
NS IRRIG 1000ML POUR BTL (IV SOLUTION) ×3 IMPLANT
PACK LAMINECTOMY ORTHO (CUSTOM PROCEDURE TRAY) ×3 IMPLANT
PAD ARMBOARD 7.5X6 YLW CONV (MISCELLANEOUS) ×6 IMPLANT
PATTIES SURGICAL .5 X.5 (GAUZE/BANDAGES/DRESSINGS) IMPLANT
PATTIES SURGICAL .75X.75 (GAUZE/BANDAGES/DRESSINGS) IMPLANT
SPONGE LAP 4X18 X RAY DECT (DISPOSABLE) IMPLANT
SPONGE SURGIFOAM ABS GEL 100 (HEMOSTASIS) ×3 IMPLANT
STRIP CLOSURE SKIN 1/2X4 (GAUZE/BANDAGES/DRESSINGS) ×2 IMPLANT
SUT VIC AB 1 CTX 36 (SUTURE) ×4
SUT VIC AB 1 CTX36XBRD ANBCTR (SUTURE) ×2 IMPLANT
SUT VIC AB 2-0 CT1 27 (SUTURE) ×2
SUT VIC AB 2-0 CT1 TAPERPNT 27 (SUTURE) ×1 IMPLANT
SUT VIC AB 3-0 X1 27 (SUTURE) ×3 IMPLANT
SUT VICRYL 0 UR6 27IN ABS (SUTURE) ×3 IMPLANT
SYR 20CC LL (SYRINGE) ×3 IMPLANT
SYR CONTROL 10ML LL (SYRINGE) ×3 IMPLANT
TOWEL OR 17X24 6PK STRL BLUE (TOWEL DISPOSABLE) ×3 IMPLANT
TOWEL OR 17X26 10 PK STRL BLUE (TOWEL DISPOSABLE) ×3 IMPLANT
WATER STERILE IRR 1000ML POUR (IV SOLUTION) ×3 IMPLANT
WND VAC CANISTER 500ML (MISCELLANEOUS) ×3 IMPLANT
YANKAUER SUCT BULB TIP NO VENT (SUCTIONS) ×3 IMPLANT

## 2017-06-11 NOTE — Consult Note (Signed)
[facility]       Reason for Consult: wound infection    Referring Physician: Dr. Louanne Skye  Principal Problem:   Deep incisional surgical site infection Active Problems:   Status post lumbar spinal fusion   . amLODipine  5 mg Oral Daily  . celecoxib  200 mg Oral Q12H  . citalopram  20 mg Oral Daily  . docusate sodium  100 mg Oral BID  . ferrous sulfate  325 mg Oral TID PC  . lacosamide  150 mg Oral BID  . levETIRAcetam  750 mg Oral BID  . lisinopril  40 mg Oral Daily  . midazolam      . pantoprazole  40 mg Oral Daily    Recommendations: Continue with vancomycin  I will narrow zosyn to ceftriaxone since Pseudomonal coverage not typically indicated   Assessment: He has post surgical wound infection after fusion at L5-S1 done 05/09/17.    Antibiotics: Vancomycin and zosyn  HPI: [name] is a 61 y.o. male with L5-S1 stenosis with surgical intervention as above who had a post op seroma and some mild erythema at the incision site then more pain and opening area of the incision site.  Culture obtained in the office, no results yet and he had elevated inflammatory markers with ESR of 63 and CRP of 120.  He has been more limited in his movements due to pain and came in for surgical debridment which was done today.  There was purulence noted under the incision and extended to the left and midline deep to the fascia but no purulence noted deeper.  Culture was taken deep.  He has been on Keflex.     Review of Systems:  Constitutional: negative for fevers and chills Gastrointestinal: negative for diarrhea All other systems reviewed and are negative    Past Medical History:  Diagnosis Date  . Depression   . GERD (gastroesophageal reflux disease)    OTC meds  . Headache   . History of kidney stones   . Hypertension   . Seizures ([place])    several months since the last surgery    Social History  Substance Use Topics  . Smoking status: Never  Smoker  . Smokeless tobacco: Never Used  . Alcohol use 2.4 oz/week    4 Cans of beer per week    No family history on file.  Allergies  Allergen Reactions  . No Known Allergies     Physical Exam: Constitutional: in no apparent distress and alert  Vitals:   06/11/17 1515 06/11/17 1522  BP:    Pulse: 76   Resp: 16   Temp:  98.3 F (36.8 C)   EYES: anicteric ENMT:no thrush Cardiovascular: Cor RRR Respiratory: CTA B; normal respiratory effort GI: Bowel sounds are normal, liver is not enlarged, spleen is not enlarged Musculoskeletal: no pedal edema noted Skin: negatives: no rash, multiple tattoos Hematologic: no cervical lad  Lab Results  Component Value Date   WBC 11.0 (H) 06/10/2017   HGB 10.9 (L) 06/10/2017   HCT 32.9 (L) 06/10/2017   MCV 97.1 06/10/2017   PLT 240 06/10/2017    Lab Results  Component Value Date   CREATININE 0.98 06/10/2017   BUN 12 06/10/2017   NA 131 (L) 06/10/2017   K 5.1 06/10/2017   CL 98 (L) 06/10/2017   CO2 26 06/10/2017    Lab Results  Component Value Date   ALT 11 (L) 06/10/2017   AST  14 (L) 06/10/2017   ALKPHOS 94 06/10/2017     Microbiology: Recent Results (from the past 240 hour(s))  MRSA PCR Screening     Status: None   Collection Time: 06/10/17  4:49 PM  Result Value Ref Range Status   MRSA by PCR NEGATIVE NEGATIVE Final    Comment:        The GeneXpert MRSA Assay (FDA approved for NASAL specimens only), is one component of a comprehensive MRSA colonization surveillance program. It is not intended to diagnose MRSA infection nor to guide or monitor treatment for MRSA infections.     Scharlene Gloss, [facility] [email or web address] [phone] pager  [phone] cell 06/11/2017, 3:57 PM

## 2017-06-11 NOTE — Brief Op Note (Signed)
06/10/2017 - 06/11/2017  12:07 PM  PATIENT:  Jenean Lindauandall K Allegretto  61 y.o. male  PRE-OPERATIVE DIAGNOSIS:  lumbar surgical site infection post fusion  POST-OPERATIVE DIAGNOSIS:  lumbar surgical site infection post fusion  PROCEDURE:  Procedure(s): Incision, Drainage and Debridement of lumbar surgical incision site, possible vac placement (N/A)  SURGEON:  Surgeon(s) and Role:    * Kerrin ChampagneNitka, James E, MD - Primary  ANESTHESIA:   general, Dr. Noreene LarssonJoslin  EBL:100cc BLOOD ADMINISTERED:none  DRAINS: Wound VAC applied.   LOCAL MEDICATIONS USED:  NONE  SPECIMEN:  Source of Specimen:  Subcutaneous tissue, fascia, muscle, C&S anaerobic and aerobic  DISPOSITION OF SPECIMEN:  Microbiology  COUNTS:  YES  TOURNIQUET:  * No tourniquets in log *  DICTATION: .Dragon Dictation  PLAN OF CARE: Admit to inpatient   PATIENT DISPOSITION:  PACU - hemodynamically stable.   Delay start of Pharmacological VTE agent (>24hrs) due to surgical blood loss or risk of bleeding: yes

## 2017-06-11 NOTE — Anesthesia Preprocedure Evaluation (Signed)
Anesthesia Evaluation  Patient identified by MRN, date of birth, ID band Patient awake    Reviewed: Allergy & Precautions, NPO status , Patient's Chart, lab work & pertinent test results  Airway Mallampati: II  TM Distance: >3 FB Neck ROM: Full    Dental  (+) Edentulous Upper, Partial Lower   Pulmonary    breath sounds clear to auscultation       Cardiovascular hypertension,  Rhythm:Regular Rate:Normal     Neuro/Psych    GI/Hepatic   Endo/Other    Renal/GU      Musculoskeletal   Abdominal   Peds  Hematology   Anesthesia Other Findings   Reproductive/Obstetrics                             Anesthesia Physical Anesthesia Plan  ASA: II  Anesthesia Plan: General   Post-op Pain Management:    Induction: Intravenous  PONV Risk Score and Plan: Ondansetron and Propofol  Airway Management Planned: Oral ETT  Additional Equipment:   Intra-op Plan:   Post-operative Plan: Extubation in OR  Informed Consent: I have reviewed the patients History and Physical, chart, labs and discussed the procedure including the risks, benefits and alternatives for the proposed anesthesia with the patient or authorized representative who has indicated his/her understanding and acceptance.     Plan Discussed with: CRNA and Anesthesiologist  Anesthesia Plan Comments:         Anesthesia Quick Evaluation

## 2017-06-11 NOTE — Transfer of Care (Signed)
Immediate Anesthesia Transfer of Care Note  Patient: Daniel LindauRandall K Candela  Procedure(s) Performed: Procedure(s): Incision, Drainage and Debridement of lumbar surgical incision site, possible vac placement (N/A)  Patient Location: PACU  Anesthesia Type:General  Level of Consciousness: awake, alert , oriented and patient cooperative  Airway & Oxygen Therapy: Patient Spontanous Breathing and Patient connected to nasal cannula oxygen  Post-op Assessment: Report given to RN, Post -op Vital signs reviewed and stable and Patient moving all extremities X 4  Post vital signs: Reviewed and stable  Last Vitals:  Vitals:   06/10/17 2256 06/11/17 0521  BP:  97/65  Pulse:  70  Temp: 37.2 C 36.6 C    Last Pain:  Vitals:   06/11/17 0521  TempSrc: Oral  PainSc:       Patients Stated Pain Goal: 5 (06/10/17 1645)  Complications: No apparent anesthesia complications

## 2017-06-11 NOTE — Op Note (Signed)
06/10/2017 - 06/11/2017  12:15 PM  PATIENT:  [name]  61 y.o. male  MRN: [id number]  OPERATIVE REPORT  PRE-OPERATIVE DIAGNOSIS:  lumbar surgical site infection post fusion  POST-OPERATIVE DIAGNOSIS:  lumbar surgical site infection post fusion  PROCEDURE:  Procedure(s): Incision, Drainage and Debridement of lumbar surgical incision site, possible vac placement    SURGEON:  Jessy Oto, MD     ANESTHESIA:  Rico Sheehan.    COMPLICATIONS:  None.     DRAINS: Wound VAC to the lumbar spine to 125 mmHg vacuum  PROCEDURE: The patient was met in the holding area, and the appropriate lumbar area identified not marked with an "X" and my initials as it represents midline structure with obvious drainage open wound.. The patient was then transported to OR, then placed under  general anesthesia without difficulty.and was placed on the operative table with Wilson frame all pressure areas well padded the arms at 90 9D at the side in a prone position position. The patient had received preoperative antibiotic of vancomycin and Zosyn.  . Lumbar spine from the lower dorsal spine to the sacrum was then prepped using sterile conditions and Betadine scrub and Betadine prep and draped using sterile technique.  Time-out procedure was called and correct.  Using headlight and loupe magnification the initial incision E. The upper third of the 4inch incision scar in this patient's lumbar spine extending from about L4-S1. Incision ellipsing the old incision scar and a carried down along the subcutaneous layers by entering into a small area of purulent drainge at  The upper incision area. Subcutaneous sutures were removed the drainage found was purulent fluid.  Palpating deep the lumbodorsal fascia was found to be intact however the purulent drainage appeared to extend to the left and midline deep to the fascia so that the entire incision fascia was opened and the right L5 pedicle screw  exposed. All suture material was then excised using hemostats.The deeper tissues were then cultured with a swab and sent for both for Gram stain and anaerobic and aerobic culture and sensitivities. The lower 23rds of the incision appeared intact. Fascia and muscle over the left and central incision were debrided to more normal appearing tissue. Irrigation was then carried out using 250 mL of  double antibiotic solution. The edges showed good bleeding tissue throughout no further purulence was noted.The sponge then for the VACt was carefully fashioned and placed into the open wound site the entire material A 3" x 4" sponge was able to be placed into the open wound site the edges circumferentially about the back incision site was then carefully dried and then the patient had applied the Vi-Drape like material for the [facility] wound dressing. Central portions of the dressing overlying the sponge were then cut out and the exiting tube then applied to this area 125 mmHg negative pressure applied which showed excellent drawing down of the soft tissues with no sign of air leakage. Patient then was carefully returned to her bed reactivated and extubated by anesthesia and then to the recovery room in satisfactory condition. The edges of the incision were debrided and this was an excisional debridement measuring about 2 cm thick by a depth of about 5 cm and the length of nearly 10cm.  SPECIMEN: Swab sent of the seroma cavity the subcutaneous layers of the skin were sent for back and aerobic culture and sensitivity.         Jessy Oto  06/11/2017,  12:15 PM

## 2017-06-11 NOTE — Anesthesia Procedure Notes (Signed)
Procedure Name: Intubation Date/Time: 06/11/2017 11:07 AM Performed by: Carney Living Pre-anesthesia Checklist: Patient identified, Emergency Drugs available, Suction available, Patient being monitored and Timeout performed Patient Re-evaluated:Patient Re-evaluated prior to inductionOxygen Delivery Method: Circle system utilized Preoxygenation: Pre-oxygenation with 100% oxygen Intubation Type: IV induction Ventilation: Mask ventilation without difficulty and Oral airway inserted - appropriate to patient size Laryngoscope Size: Mac and 4 Grade View: Grade I Tube type: Oral Tube size: 7.5 mm Number of attempts: 1 Airway Equipment and Method: Stylet Placement Confirmation: ETT inserted through vocal cords under direct vision,  positive ETCO2 and breath sounds checked- equal and bilateral Secured at: 22 cm Tube secured with: Tape Dental Injury: Teeth and Oropharynx as per pre-operative assessment

## 2017-06-11 NOTE — Anesthesia Postprocedure Evaluation (Signed)
Anesthesia Post Note  Patient: Ky BarbanRandall K Schonberg  Procedure(s) Performed: Procedure(s) (LRB): Incision, Drainage and Debridement of lumbar surgical incision site, possible vac placement (N/A)     Patient location during evaluation: PACU Anesthesia Type: General Level of consciousness: awake, oriented and awake and alert Pain management: pain level controlled Vital Signs Assessment: post-procedure vital signs reviewed and stable Respiratory status: spontaneous breathing, nonlabored ventilation and respiratory function stable Cardiovascular status: blood pressure returned to baseline Anesthetic complications: no    Last Vitals:  Vitals:   06/11/17 1522 06/11/17 1556  BP:  109/66  Pulse:  74  Resp:  16  Temp: 36.8 C 36.4 C    Last Pain:  Vitals:   06/11/17 1742  TempSrc:   PainSc: 8                  Dhruti Ghuman COKER

## 2017-06-12 ENCOUNTER — Encounter (HOSPITAL_COMMUNITY): Payer: Self-pay

## 2017-06-12 DIAGNOSIS — B9561 Methicillin susceptible Staphylococcus aureus infection as the cause of diseases classified elsewhere: Secondary | ICD-10-CM

## 2017-06-12 LAB — COMPREHENSIVE METABOLIC PANEL
ALT: 9 U/L — ABNORMAL LOW (ref 17–63)
ANION GAP: 6 (ref 5–15)
AST: 11 U/L — ABNORMAL LOW (ref 15–41)
Albumin: 2.3 g/dL — ABNORMAL LOW (ref 3.5–5.0)
Alkaline Phosphatase: 83 U/L (ref 38–126)
BUN: 17 mg/dL (ref 6–20)
CHLORIDE: 103 mmol/L (ref 101–111)
CO2: 27 mmol/L (ref 22–32)
CREATININE: 1 mg/dL (ref 0.61–1.24)
Calcium: 8.3 mg/dL — ABNORMAL LOW (ref 8.9–10.3)
Glucose, Bld: 101 mg/dL — ABNORMAL HIGH (ref 65–99)
Potassium: 4.3 mmol/L (ref 3.5–5.1)
SODIUM: 136 mmol/L (ref 135–145)
Total Bilirubin: 0.2 mg/dL — ABNORMAL LOW (ref 0.3–1.2)
Total Protein: 5.5 g/dL — ABNORMAL LOW (ref 6.5–8.1)

## 2017-06-12 LAB — WOUND CULTURE
Gram Stain: NONE SEEN
Gram Stain: NONE SEEN

## 2017-06-12 MED ORDER — CEFAZOLIN SODIUM-DEXTROSE 1-4 GM/50ML-% IV SOLN
1.0000 g | Freq: Three times a day (TID) | INTRAVENOUS | Status: DC
  Administered 2017-06-12 – 2017-06-15 (×9): 1 g via INTRAVENOUS
  Filled 2017-06-12 (×11): qty 50

## 2017-06-12 NOTE — Progress Notes (Signed)
PHARMACY CONSULT NOTE FOR:  OUTPATIENT  PARENTERAL ANTIBIOTIC THERAPY (OPAT)  Indication: mssa wound infection Regimen: cefazolin 1 g q8 End date: 07/08/17  IV antibiotic discharge orders are pended. To discharging provider:  please sign these orders via discharge navigator,  Select New Orders & click on the button choice - Manage This Unsigned Work.    Isaac BlissMichael Amine Adelson, PharmD, BCPS, BCCCP Clinical Pharmacist 06/12/2017 3:34 PM

## 2017-06-12 NOTE — Progress Notes (Signed)
[facility] for [facility]ious [facility]   Reason for visit: Follow up on post op wound infection  Interval History: cultures all growing MSSA from the clinic and both cultures done operatively; no fever, WBC wnl.  No associated n/v/d.  No rash.   Day 2 antibiotics Day 1 cefazolin  Physical Exam: Constitutional:  Vitals:   06/11/17 1947 06/12/17 0400  BP: (!) 87/64 97/63  Pulse: 85 79  Resp:    Temp: 97.6 F (36.4 C) 98.3 F (36.8 C)   patient appears in NAD, up in chair Eyes: anicteric HENT: no thrush Respiratory: Normal respiratory effort; CTA B Cardiovascular: RRR Back: has brace on  Review of Systems: Constitutional: negative for fevers, chills, fatigue and malaise Respiratory: negative for cough Gastrointestinal: negative for diarrhea  Lab Results  Component Value Date   WBC 9.4 06/11/2017   HGB 9.9 (L) 06/11/2017   HCT 30.9 (L) 06/11/2017   MCV 97.8 06/11/2017   PLT 256 06/11/2017    Lab Results  Component Value Date   CREATININE 1.00 06/12/2017   BUN 17 06/12/2017   NA 136 06/12/2017   K 4.3 06/12/2017   CL 103 06/12/2017   CO2 27 06/12/2017    Lab Results  Component Value Date   ALT 9 (L) 06/12/2017   AST 11 (L) 06/12/2017   ALKPHOS 83 06/12/2017     Microbiology: Recent Results (from the past 240 hour(s))  Wound culture     Status: None   Collection Time: 06/09/17  2:30 PM  Result Value Ref Range Status   Culture STAPHYLOCOCCUS AUREUS  Final   Gram Stain Few  Final   Gram Stain WBC present-both PMN and Mononuclear  Final   Gram Stain No Squamous Epithelial Cells Seen  Final   Gram Stain No Organisms Seen  Final    Comment: SUSCEPTIBILITY TEST REPORT TO FOLLOW Rifampin and Gentamicin should not be used as single drugs for treatment of Staph infections.    Colony Count Moderate  Final   Organism ID, Bacteria STAPHYLOCOCCUS AUREUS  Final      Susceptibility   Staphylococcus aureus -  (no method available)    OXACILLIN 0.5 Sensitive    CEFAZOLIN  Sensitive     GENTAMICIN <=0.5 Sensitive     CIPROFLOXACIN <=0.5 Sensitive     LEVOFLOXACIN 0.25 Sensitive     MOXIFLOXACIN <=0.25 Sensitive     TRIMETH/SULFA <=10 Sensitive     VANCOMYCIN <=0.5 Sensitive     CLINDAMYCIN <=0.25 Sensitive     ERYTHROMYCIN <=0.25 Sensitive     LINEZOLID 2 Sensitive     QUINUPRISTIN/DALF <=0.25 Sensitive     RIFAMPIN <=0.5 Sensitive     TETRACYCLINE <=1 Sensitive   MRSA PCR Screening     Status: None   Collection Time: 06/10/17  4:49 PM  Result Value Ref Range Status   MRSA by PCR NEGATIVE NEGATIVE Final    Comment:        The GeneXpert MRSA Assay (FDA approved for NASAL specimens only), is one component of a comprehensive MRSA colonization surveillance program. It is not intended to diagnose MRSA infection nor to guide or monitor treatment for MRSA infections.   Aerobic/Anaerobic Culture (surgical/deep wound)     Status: None (Preliminary result)   Collection Time: 06/11/17 11:44 AM  Result Value Ref Range Status   Specimen Description WOUND BACK  Final   Special Requests LUMBAR INCISION ABSCESS  Final   Gram Stain   Final  MODERATE WBC PRESENT, PREDOMINANTLY PMN RARE GRAM POSITIVE COCCI IN PAIRS IN SINGLES    Culture RARE STAPHYLOCOCCUS AUREUS  Final   Report Status PENDING  Incomplete  Aerobic/Anaerobic Culture (surgical/deep wound)     Status: None (Preliminary result)   Collection Time: 06/11/17 12:15 PM  Result Value Ref Range Status   Specimen Description ABSCESS LUMBAR INCISION TISSUE  Final   Special Requests NONE  Final   Gram Stain   Final    ABUNDANT WBC PRESENT,BOTH PMN AND MONONUCLEAR NO ORGANISMS SEEN    Culture   Final    FEW STAPHYLOCOCCUS AUREUS CRITICAL RESULT CALLED TO, READ BACK BY AND VERIFIED WITH: K ADAMS,RN AT 1149 06/12/17 BY L BENFIELD CONCERNING GROWTH ON CULTURE    Report Status PENDING  Incomplete    Impression/Plan:  1. Post op wound infection - with MSSA.  I will target this with  cefazolin.  ESR 63 and CRP 113 and patient had been febrile.  Therefore I will treat this with Iv cefazolin and plan on 3-4 weeks of treatment.  I will trend the CRP and ESR Antibiotics per home health through July 13th I will arrange follow up in our clinic prior to the stop date I will order a picc line Weekly labs including ESR, CRP  2.  Disposition - I will order the OPAT consult for discharge medication order.   3.  Screening - HIV negative  I will sign off, thanks for consultation

## 2017-06-12 NOTE — Evaluation (Signed)
Occupational Therapy Evaluation and Discharge Patient Details Name: [name] MRN: [id number] DOB: 1956/12/06 Today's Date: 06/12/2017    History of Present Illness Pt is a 61 y.o. male presenting with L5-S1 lumbar fusion (4.5 weeks post op) wound infection now s/p I&D with wound vac placement. PMHx: Depression, GERD, HTN, Seizures, bil THA, L rotator cuff repair.   Clinical Impression   Pt reports he was managing ADL with mod I PTA. Currently pt overall supervision for ADL and functional mobility; quickly approaching mod I. Pt able to recall all back, safety, and ADL education from previous hospitalization. Pt planning to d/c initially to his daughters house where he will have 24/7 supervision prior to returning home alone. No further acute OT needs identified; signing off at this time. Please re-consult if needs change. Thank you for this referral.    Follow Up Recommendations  No OT follow up;Supervision - Intermittent    Equipment Recommendations  None recommended by OT    Recommendations for Other Services       Precautions / Restrictions Precautions Precautions: Back Precaution Booklet Issued: No Precaution Comments: Pt able to recall 3/3 back precautions from prior education Required Braces or Orthoses: Spinal Brace Spinal Brace: Lumbar corset Restrictions Weight Bearing Restrictions: No      Mobility Bed Mobility Overal bed mobility: Modified Independent             General bed mobility comments: HOB elevated. Pt with good log roll technique and no assist required  Transfers Overall transfer level: Needs assistance Equipment used: Rolling walker (2 wheeled) Transfers: Sit to/from Stand Sit to Stand: Supervision         General transfer comment: for safety    Balance Overall balance assessment: No apparent balance deficits (not formally assessed)                                         ADL either performed or assessed with  clinical judgement   ADL Overall ADL's : Needs assistance/impaired Eating/Feeding: Independent;Sitting   Grooming: Supervision/safety;Standing;Wash/dry hands   Upper Body Bathing: Set up;Sitting   Lower Body Bathing: Supervison/ safety;Sit to/from stand   Upper Body Dressing : Set up;Sitting   Lower Body Dressing: Supervision/safety;Sit to/from stand Lower Body Dressing Details (indicate cue type and reason): Good technique for adjusting socks Toilet Transfer: Supervision/safety;Ambulation;RW Toilet Transfer Details (indicate cue type and reason): Simulated by sit to stand from EOB with functional mobility. Pt able to stand at toilet to void         Functional mobility during ADLs: Supervision/safety;Rolling walker General ADL Comments: Pt able to recall all back and ADL eudcation from prior back sx ~4.5 weeks ago.     Vision         Perception     Praxis      Pertinent Vitals/Pain Pain Assessment: 0-10 Pain Score: 8  Pain Location: back Pain Descriptors / Indicators: Sharp Pain Intervention(s): Monitored during session;Repositioned;Patient requesting pain meds-RN notified     Hand Dominance Right   Extremity/Trunk Assessment Upper Extremity Assessment Upper Extremity Assessment: Overall WFL for tasks assessed   Lower Extremity Assessment Lower Extremity Assessment: Defer to PT evaluation   Cervical / Trunk Assessment Cervical / Trunk Assessment: Other exceptions Cervical / Trunk Exceptions: s/p recent spinal sx   Communication Communication Communication: No difficulties   Cognition Arousal/Alertness: Awake/alert Behavior During Therapy: WFL for tasks assessed/performed  Overall Cognitive Status: Within Functional Limits for tasks assessed                                     General Comments       Exercises     Shoulder Instructions      Home Living Family/patient expects to be discharged to:: Private residence Living  Arrangements: Alone Available Help at Discharge: Family;Available 24 hours/day Type of Home: House Home Access: Stairs to enter Entergy CorporationEntrance Stairs-Number of Steps: 3 Entrance Stairs-Rails: None Home Layout: One level     Bathroom Shower/Tub: Producer, television/film/videoWalk-in shower   Bathroom Toilet: Standard     Home Equipment: Environmental consultantWalker - 2 wheels;Bedside commode   Additional Comments: Planning to stay with daughter again at d/c      Prior Functioning/Environment Level of Independence: Independent with assistive device(s)        Comments: RW for mobility, independent with ADL, had moved back to his house ~1 week post op        OT Problem List:        OT Treatment/Interventions:      OT Goals(Current goals can be found in the care plan section) Acute Rehab OT Goals Patient Stated Goal: get better OT Goal Formulation: All assessment and education complete, DC therapy  OT Frequency:     Barriers to D/C:            Co-evaluation              AM-PAC PT "6 Clicks" Daily Activity     Outcome Measure Help from another person eating meals?: None Help from another person taking care of personal grooming?: A Little Help from another person toileting, which includes using toliet, bedpan, or urinal?: A Little Help from another person bathing (including washing, rinsing, drying)?: A Little Help from another person to put on and taking off regular upper body clothing?: None Help from another person to put on and taking off regular lower body clothing?: A Little 6 Click Score: 20   End of Session Equipment Utilized During Treatment: Back brace;Rolling walker Nurse Communication: Mobility status;Patient requests pain meds;Other (comment) (IV beeping)  Activity Tolerance: Patient tolerated treatment well Patient left: in chair;with call bell/phone within reach  OT Visit Diagnosis: Pain Pain - part of body:  (back)                Time: [id number] OT Time Calculation (min): 14 min Charges:  OT  General Charges $OT Visit: 1 Procedure OT Evaluation $OT Eval Moderate Complexity: 1 Procedure G-Codes:     Hussain Maimone A. Brett Albinooffey, M.S., OTR/L Pager: [phone]  Gaye AlkenBailey A Eileene Kisling 06/12/2017, 8:44 AM

## 2017-06-12 NOTE — Progress Notes (Signed)
   Subjective: 1 Day Post-Op Procedure(s) (LRB): Incision, Drainage and Debridement of lumbar surgical incision site, possible vac placement (N/A) Patient reports pain as mild and moderate.   OOB to recliner.   Objective: Vital signs in last 24 hours: Temp:  [97.5 F (36.4 C)-98.3 F (36.8 C)] 98.3 F (36.8 C) (06/17 0400) Pulse Rate:  [74-108] 79 (06/17 0400) Resp:  [13-26] 16 (06/16 1556) BP: (87-130)/(56-82) 97/63 (06/17 0400) SpO2:  [96 %-100 %] 99 % (06/17 0400)  Intake/Output from previous day: 06/16 0701 - 06/17 0700 In: 4167.5 [P.O.:960; I.V.:2300; IV Piggyback:907.5] Out: 1250 [Urine:1150; Drains:100] Intake/Output this shift: Total I/O In: -  Out: 550 [Urine:550]   Recent Labs  06/09/17 1431 06/10/17 2008 06/11/17 1621  HGB 11.9* 10.9* 9.9*    Recent Labs  06/10/17 2008 06/11/17 1621  WBC 11.0* 9.4  RBC 3.39* 3.16*  HCT 32.9* 30.9*  PLT 240 256    Recent Labs  06/10/17 2008 06/12/17 0429  NA 131* 136  K 5.1 4.3  CL 98* 103  CO2 26 27  BUN 12 17  CREATININE 0.98 1.00  GLUCOSE 118* 101*  CALCIUM 8.8* 8.3*    Recent Labs  06/10/17 2008  INR 1.17    Neurologically intact  VAC low leak rate. No results found.  Assessment/Plan: 1 Day Post-Op Procedure(s) (LRB): Incision, Drainage and Debridement of lumbar surgical incision site, possible vac placement (N/A) Continue ABX therapy due to Post-op infection.   Hypoalbuminemia noted. I discussed with him importance of increased PO intake for healing and association with infection etc.  He stated his appetite is back now.  Eldred MangesMark C My Madariaga 06/12/2017, 9:59 AM

## 2017-06-12 NOTE — Evaluation (Signed)
Physical Therapy Evaluation Patient Details Name: [name] MRN: [id number] DOB: [birth year] Today's Date: 06/12/2017   History of Present Illness  Pt is a 61 y.o. male presenting with L5-S1 lumbar fusion (4.5 weeks post op) wound infection now s/p I&D with wound vac placement. PMHx: Depression, GERD, HTN, Seizures, bil THA, L rotator cuff repair.  Clinical Impression  Pt presents with the above diagnosis and below deficits for therapy evaluation. Prior to admission, pt was staying with his daughter and was recovery from most recent surgery. Pt was completely independent and performing gait without an AD prior to surgery in May. Pt requires supervision to perform gait and transfers from recliner. Pt will benefit from continued acute rehab services in order to address the below deficits prior to discharge.     Follow Up Recommendations No PT follow up    Equipment Recommendations  None recommended by PT    Recommendations for Other Services       Precautions / Restrictions Precautions Precautions: Back Precaution Booklet Issued: No Precaution Comments: Pt able to recall 3/3 back precautions from prior education Required Braces or Orthoses: Spinal Brace Spinal Brace: Lumbar corset Restrictions Weight Bearing Restrictions: Yes RLE Weight Bearing: Weight bearing as tolerated LLE Weight Bearing: Weight bearing as tolerated      Mobility  Bed Mobility               General bed mobility comments: pt in recliner when PT arrives  Transfers Overall transfer level: Needs assistance Equipment used: Rolling walker (2 wheeled) Transfers: Sit to/from Stand Sit to Stand: Supervision         General transfer comment: for safety  Ambulation/Gait Ambulation/Gait assistance: Supervision Ambulation Distance (Feet): 500 Feet Assistive device: Rolling walker (2 wheeled) Gait Pattern/deviations: Step-through pattern;Decreased step length - right;Decreased step length -  left Gait velocity: decreased Gait velocity interpretation: Below normal speed for age/gender General Gait Details: slower cadence, decreased step length bilaterally. No LOB  Stairs            Wheelchair Mobility    Modified Rankin (Stroke Patients Only)       Balance Overall balance assessment: No apparent balance deficits (not formally assessed)                                           Pertinent Vitals/Pain Pain Assessment: 0-10 Pain Score: 7  Pain Location: back Pain Descriptors / Indicators: Sharp Pain Intervention(s): Monitored during session;RN gave pain meds during session    Home Living Family/patient expects to be discharged to:: Private residence Living Arrangements: Alone Available Help at Discharge: Family;Available 24 hours/day Type of Home: House Home Access: Stairs to enter Entrance Stairs-Rails: None Entrance Stairs-Number of Steps: 3 Home Layout: One level Home Equipment: Walker - 2 wheels;Bedside commode Additional Comments: Planning to stay with daughter again at d/c    Prior Function Level of Independence: Independent with assistive device(s)         Comments: RW for mobility, independent with ADL, had moved back to his house ~1 week post op     Hand Dominance   Dominant Hand: Right    Extremity/Trunk Assessment   Upper Extremity Assessment Upper Extremity Assessment: Defer to OT evaluation    Lower Extremity Assessment Lower Extremity Assessment: Overall WFL for tasks assessed    Cervical / Trunk Assessment Cervical / Trunk Assessment: Other exceptions Cervical /  Trunk Exceptions: s/p recent spinal sx  Communication   Communication: No difficulties  Cognition Arousal/Alertness: Awake/alert Behavior During Therapy: WFL for tasks assessed/performed Overall Cognitive Status: Within Functional Limits for tasks assessed                                        General Comments       Exercises     Assessment/Plan    PT Assessment Patient needs continued PT services  PT Problem List Decreased strength;Decreased activity tolerance;Decreased balance;Decreased mobility;Pain       PT Treatment Interventions Gait training;Stair training;Functional mobility training;Therapeutic activities;Therapeutic exercise;Balance training    PT Goals (Current goals can be found in the Care Plan section)  Acute Rehab PT Goals Patient Stated Goal: get better PT Goal Formulation: With patient Time For Goal Achievement: 06/19/17 Potential to Achieve Goals: Good    Frequency Min 5X/week   Barriers to discharge        Co-evaluation               AM-PAC PT "6 Clicks" Daily Activity  Outcome Measure Difficulty turning over in bed (including adjusting bedclothes, sheets and blankets)?: None Difficulty moving from lying on back to sitting on the side of the bed? : None Difficulty sitting down on and standing up from a chair with arms (e.g., wheelchair, bedside commode, etc,.)?: A Little Help needed moving to and from a bed to chair (including a wheelchair)?: A Little Help needed walking in hospital room?: A Little Help needed climbing 3-5 steps with a railing? : A Little 6 Click Score: 20    End of Session Equipment Utilized During Treatment: Gait belt;Back brace Activity Tolerance: Patient tolerated treatment well Patient left: in chair;with call bell/phone within reach Nurse Communication: Mobility status PT Visit Diagnosis: Other abnormalities of gait and mobility (R26.89);Pain Pain - Right/Left:  (lower) Pain - part of body:  (lumbar)    Time: [id number] PT Time Calculation (min) (ACUTE ONLY): 18 min   Charges:   PT Evaluation $PT Eval Moderate Complexity: 1 Procedure     PT G Codes:        Colin BroachSabra M. Mariusz Jubb PT, DPT  [phone]   Ruel FavorsSabra Aletha HalimMarie Vianka Ertel 06/12/2017, 12:46 PM

## 2017-06-12 NOTE — Progress Notes (Signed)
Pharmacy Antibiotic Note  Jenean LindauRandall K Korenek is a 61 y.o. male admitted on 06/10/2017 with lumbar wound infection.  Pharmacy has been consulted for cefazolin dosing.  S/p I&D with deep cxs taken.  Plan: Ancef 1g IV q8h Follow c/s, clinical progression, renal function   Height: 5\' 8"  (172.7 cm) Weight: 152 lb (68.9 kg) IBW/kg (Calculated) : 68.4  Temp (24hrs), Avg:97.9 F (36.6 C), Min:97.5 F (36.4 C), Max:98.3 F (36.8 C)   Recent Labs Lab 06/09/17 1431 06/10/17 2008 06/11/17 1621 06/12/17 0429  WBC 10.0 11.0* 9.4  --   CREATININE  --  0.98  --  1.00    Estimated Creatinine Clearance: 77.9 mL/min (by C-G formula based on SCr of 1 mg/dL).    Allergies  Allergen Reactions  . No Known Allergies    Keflex PTA Vanc 6/15 >> 6/17 Zosyn 6/15 >> 6/16 CTX 6/16 >> 6/17. Ancef 6/17>>  6/16 lumbar abscess - few staph aureus 6/16 back wound - rare staph aureus 6/15 MRSA PCR: neg  Joselyn Edling D. Vermelle Cammarata, PharmD, BCPS Clinical Pharmacist 06/12/2017 3:24 PM

## 2017-06-13 ENCOUNTER — Encounter (HOSPITAL_COMMUNITY): Payer: Self-pay | Admitting: Specialist

## 2017-06-13 LAB — COMPREHENSIVE METABOLIC PANEL
ALK PHOS: 77 U/L (ref 38–126)
ALT: 9 U/L — AB (ref 17–63)
AST: 11 U/L — AB (ref 15–41)
Albumin: 2.4 g/dL — ABNORMAL LOW (ref 3.5–5.0)
Anion gap: 6 (ref 5–15)
BUN: 10 mg/dL (ref 6–20)
CALCIUM: 8.5 mg/dL — AB (ref 8.9–10.3)
CO2: 27 mmol/L (ref 22–32)
CREATININE: 0.82 mg/dL (ref 0.61–1.24)
Chloride: 107 mmol/L (ref 101–111)
GFR calc non Af Amer: >60 mL/min (ref >60)
Glucose, Bld: 103 mg/dL — ABNORMAL HIGH (ref 65–99)
Potassium: 4.9 mmol/L (ref 3.5–5.1)
SODIUM: 140 mmol/L (ref 135–145)
Total Bilirubin: 0.2 mg/dL — ABNORMAL LOW (ref 0.3–1.2)
Total Protein: 5.8 g/dL — ABNORMAL LOW (ref 6.5–8.1)

## 2017-06-13 MED ORDER — SODIUM CHLORIDE 0.9% FLUSH
10.0000 mL | INTRAVENOUS | Status: DC | PRN
  Administered 2017-06-15 (×2): 10 mL
  Filled 2017-06-13 (×2): qty 40

## 2017-06-13 NOTE — Progress Notes (Signed)
Peripherally Inserted Central Catheter/Midline Placement  The IV Nurse has discussed with the patient and/or persons authorized to consent for the patient, the purpose of this procedure and the potential benefits and risks involved with this procedure.  The benefits include less needle sticks, lab draws from the catheter, and the patient may be discharged home with the catheter. Risks include, but not limited to, infection, bleeding, blood clot (thrombus formation), and puncture of an artery; nerve damage and irregular heartbeat and possibility to perform a PICC exchange if needed/ordered by physician.  Alternatives to this procedure were also discussed.  Bard Power PICC patient education guide, fact sheet on infection prevention and patient information card has been provided to patient /or left at bedside.    PICC/Midline Placement Documentation  PICC Single Lumen 06/13/17 PICC Right Basilic 45 cm 2 cm (Active)  Indication for Insertion or Continuance of Line Home intravenous therapies (PICC only) 06/13/2017  1:30 PM  Exposed Catheter (cm) 2 cm 06/13/2017  1:30 PM  Site Assessment Clean;Dry;Intact 06/13/2017  1:30 PM  Line Status Flushed;Saline locked;Blood return noted 06/13/2017  1:30 PM  Dressing Type Transparent;Securing device 06/13/2017  1:30 PM  Dressing Status Clean;Dry;Intact;Antimicrobial disc in place 06/13/2017  1:30 PM  Dressing Change Due 06/20/17 06/13/2017  1:30 PM       Romie Jumperlford, Greco Gastelum Terry 06/13/2017, 1:40 PM

## 2017-06-13 NOTE — Progress Notes (Signed)
Physical Therapy Treatment Patient Details Name: [name] MRN: [id number] DOB: January 20, 1956 Today's Date: 06/13/2017    History of Present Illness Pt is a 61 y.o. male presenting with L5-S1 lumbar fusion (4.5 weeks post op) wound infection now s/p I&D with wound vac placement. PMHx: Depression, GERD, HTN, Seizures, bil THA, L rotator cuff repair.    PT Comments    Pt progressing well.  Remains to c/o pain at incision site.  Education for safety.  Pt slightly impulsive during tx but easy to re-direct.   Pt for no PT follow up remains appropriate.    Follow Up Recommendations  No PT follow up     Equipment Recommendations  None recommended by PT    Recommendations for Other Services       Precautions / Restrictions Precautions Precautions: Back Precaution Booklet Issued: No Precaution Comments: Pt able to recall 3/3 back precautions from prior education Required Braces or Orthoses: Spinal Brace Spinal Brace: Lumbar corset (Pt is independent with application.  Donned in standing.  ) Restrictions Weight Bearing Restrictions: No    Mobility  Bed Mobility               General bed mobility comments: Pt sitting edge of bed finishing his bath on arrival.    Transfers Overall transfer level: Needs assistance Equipment used: Rolling walker (2 wheeled) Transfers: Sit to/from Stand Sit to Stand: Supervision         General transfer comment: Cues for hand placement to and from seated surfaces.    Ambulation/Gait Ambulation/Gait assistance: Supervision Ambulation Distance (Feet): 650 Feet Assistive device: Rolling walker (2 wheeled) Gait Pattern/deviations: Step-through pattern;Decreased step length - right;Decreased step length - left Gait velocity: decreased   General Gait Details: slower cadence, decreased step length bilaterally. No LOB   Stairs Stairs: Yes   Stair Management: Two rails;Alternating pattern Number of Stairs: 12 General stair comments:  Cues for hand placement.  Good technique.  NO LOB.    Wheelchair Mobility    Modified Rankin (Stroke Patients Only)       Balance Overall balance assessment: No apparent balance deficits (not formally assessed)                                          Cognition Arousal/Alertness: Awake/alert Behavior During Therapy: WFL for tasks assessed/performed Overall Cognitive Status: Within Functional Limits for tasks assessed                                        Exercises      General Comments        Pertinent Vitals/Pain Pain Assessment: 0-10 Pain Score: 7  Pain Location: back Pain Descriptors / Indicators: Sharp Pain Intervention(s): Monitored during session;Repositioned;Patient requesting pain meds-RN notified    Home Living                      Prior Function            PT Goals (current goals can now be found in the care plan section) Acute Rehab PT Goals Patient Stated Goal: get better Potential to Achieve Goals: Good Progress towards PT goals: Progressing toward goals    Frequency    Min 5X/week      PT Plan Current plan remains  appropriate    Co-evaluation              AM-PAC PT "6 Clicks" Daily Activity  Outcome Measure  Difficulty turning over in bed (including adjusting bedclothes, sheets and blankets)?: None Difficulty moving from lying on back to sitting on the side of the bed? : None Difficulty sitting down on and standing up from a chair with arms (e.g., wheelchair, bedside commode, etc,.)?: None Help needed moving to and from a bed to chair (including a wheelchair)?: None Help needed walking in hospital room?: None Help needed climbing 3-5 steps with a railing? : A Little 6 Click Score: 23    End of Session Equipment Utilized During Treatment: Gait belt;Back brace Activity Tolerance: Patient tolerated treatment well Patient left: in chair;with call bell/phone within reach Nurse  Communication: Mobility status PT Visit Diagnosis: Other abnormalities of gait and mobility (R26.89);Pain Pain - Right/Left:  (lower) Pain - part of body:  (lumbar)     Time: 5784-69621516-1550 PT Time Calculation (min) (ACUTE ONLY): 34 min  Charges:  $Gait Training: 8-22 mins $Therapeutic Activity: 8-22 mins                    G Codes:       [name], PTA pager [phone]    [name] 06/13/2017, 4:00 PM

## 2017-06-13 NOTE — Progress Notes (Signed)
     Subjective: 2 Days Post-Op Procedure(s) (LRB): Incision, Drainage and Debridement of lumbar surgical incision site, possible vac placement (N/A) Awake,alert and oriented x 4. VAC dressing changed, wound is granulating. Continues on IV antibiotics. Cultures positive for MSSA.  Patient reports pain as moderate.    Objective:   VITALS:  Temp:  [98.1 F (36.7 C)-98.5 F (36.9 C)] 98.1 F (36.7 C) (06/18 1445) Pulse Rate:  [70-85] 70 (06/18 1445) Resp:  [16-18] 18 (06/18 1445) BP: (105-112)/(62-75) 105/62 (06/18 1445) SpO2:  [100 %] 100 % (06/18 1445)  Neurologically intact ABD soft Neurovascular intact Sensation intact distally VAC changed Dr Ophelia CharterYates present at the change. Granulation of the wound.   LABS  Recent Labs  06/11/17 1621  HGB 9.9*  WBC 9.4  PLT 256    Recent Labs  06/12/17 0429 06/13/17 0453  NA 136 140  K 4.3 4.9  CL 103 107  CO2 27 27  BUN 17 10  CREATININE 1.00 0.82  GLUCOSE 101* 103*   No results for input(s): LABPT, INR in the last 72 hours.   Assessment/Plan: 2 Days Post-Op Procedure(s) (LRB): Incision, Drainage and Debridement of lumbar surgical incision site, possible vac placement (N/A)  Advance diet Up with therapy Continue IV antibiotic. Await ID help with antibiotic course.  [name] 06/13/2017, 9:29 PM Patient ID: [name], male   DOB: Nov 27, 1956, 61 y.o.   MRN: [id number]

## 2017-06-13 NOTE — Care Management Note (Signed)
Case Management Note  Patient Details  Name: Daniel LindauRandall K Danner MRN: [id number] Date of Birth: [birth year]  Subjective/Objective:   61 yr old gentleman admitted with lumbar surgical site infection post fusion. Patient underwent I & D of lumbar incision.                Action/Plan: Case manager spoke with patient concerning discharge plan and need for home wound Vac. Patient states he had [facility] and will continue with them. CM called Janeice RobinsonKaren Nusbaumm to [facility]. CM called Jeri ModenaPam Chandler, RN IV Specialist concerning patient discharging on IV Ancef. PICC line being placed this am.     Expected Discharge Date:  06/13/17               Expected Discharge Plan:  Home w Home Health Services  In-House Referral:  NA  Discharge planning Services  CM Consult  Post Acute Care Choice:  Durable Medical Equipment, Home Health Choice offered to:  Patient  DME Arranged:  Vac DME Agency:  [facility]  HH Arranged:  RN HH Agency:  [facility]  Status of Service:  Completed, signed off  If discussed at Long Length of Stay Meetings, dates discussed:    Additional Comments:  Durenda GuthrieBrady, Shedrick Sarli Naomi, RN 06/13/2017, 11:03 AM

## 2017-06-13 NOTE — Consult Note (Signed)
   [facility]   06/13/2017  Ky [facility]banRandall K [facility] December 07, 1956 [id number]   Referral received from inpatient nurse for wound Vac which is for home health care. Patient is in the Medicare ACO.  Patient has arrangements with [facility] for IVs and wound care.  Spoke with inpatient RNCM and there were no [facility] needs at this time.  For questions or referrals, please contact:  Charlesetta ShanksVictoria Amadi Yoshino, RN BSN CCM [facility] Liaison  [phone] business mobile phone Toll free office [phone]

## 2017-06-13 NOTE — Consult Note (Addendum)
WOC Nurse wound consult note Reason for Consult: first post op NPWT dressing change Wound type: surgical  Measurement: 8cm x 3.0cm x 3.5cm  Wound bed: 100% clean, pink, oozing slightly Drainage (amount, consistency, odor) serosanguinous  Periwound: intact  Dressing procedure/placement/frequency: 3pc of black foam removed from wound bed with Dr. Ophelia CharterYates at the bedside.  Replaced with 1pc of black foam uses in long continuous piece into deepest portion that is at the 12 o'clock position. Seal obtained at 125mmHG, patient tolerated well.  PO pain meds received prior to dressing change.  Dr. Otelia SergeantNitka at bedside to discuss continued need for NPWT therapy and condition of wound bed today.  OK for bedside nurse to change moving forward.   Discussed POC with patient and bedside nurse.  Re consult if needed, will not follow at this time. Thanks  Zanden Colver M.D.C. Holdingsustin MSN, RN,CWOCN, CNS, CWON-AP [phone])

## 2017-06-14 LAB — COMPREHENSIVE METABOLIC PANEL
ALBUMIN: 2.2 g/dL — AB (ref 3.5–5.0)
ALT: 9 U/L — ABNORMAL LOW (ref 17–63)
AST: 13 U/L — AB (ref 15–41)
Alkaline Phosphatase: 73 U/L (ref 38–126)
Anion gap: 7 (ref 5–15)
BUN: 6 mg/dL (ref 6–20)
CHLORIDE: 106 mmol/L (ref 101–111)
CO2: 26 mmol/L (ref 22–32)
Calcium: 8.3 mg/dL — ABNORMAL LOW (ref 8.9–10.3)
Creatinine, Ser: 0.66 mg/dL (ref 0.61–1.24)
GFR calc Af Amer: >60 mL/min (ref >60)
GLUCOSE: 107 mg/dL — AB (ref 65–99)
POTASSIUM: 4 mmol/L (ref 3.5–5.1)
Sodium: 139 mmol/L (ref 135–145)
Total Bilirubin: 0.1 mg/dL — ABNORMAL LOW (ref 0.3–1.2)
Total Protein: 5.5 g/dL — ABNORMAL LOW (ref 6.5–8.1)

## 2017-06-14 LAB — CBC WITH DIFFERENTIAL/PLATELET
Basophils Absolute: 0 10*3/uL (ref 0.0–0.1)
Basophils Relative: 0 %
EOS ABS: 0.2 10*3/uL (ref 0.0–0.7)
EOS PCT: 4 %
HCT: 30.4 % — ABNORMAL LOW (ref 39.0–52.0)
Hemoglobin: 9.8 g/dL — ABNORMAL LOW (ref 13.0–17.0)
LYMPHS ABS: 1.4 10*3/uL (ref 0.7–4.0)
LYMPHS PCT: 23 %
MCH: 31.5 pg (ref 26.0–34.0)
MCHC: 32.2 g/dL (ref 30.0–36.0)
MCV: 97.7 fL (ref 78.0–100.0)
MONO ABS: 0.4 10*3/uL (ref 0.1–1.0)
MONOS PCT: 6 %
Neutro Abs: 4 10*3/uL (ref 1.7–7.7)
Neutrophils Relative %: 67 %
PLATELETS: 385 10*3/uL (ref 150–400)
RBC: 3.11 MIL/uL — ABNORMAL LOW (ref 4.22–5.81)
RDW: 14.4 % (ref 11.5–15.5)
WBC: 6 10*3/uL (ref 4.0–10.5)

## 2017-06-14 LAB — C-REACTIVE PROTEIN: CRP: 6.1 mg/dL — AB (ref <1.0)

## 2017-06-14 NOTE — Progress Notes (Signed)
Patient was advised at the last appt.

## 2017-06-14 NOTE — Progress Notes (Signed)
     Subjective: 3 Days Post-Op Procedure(s) (LRB): Incision, Drainage and Debridement of lumbar surgical incision site, possible vac placement (N/A) Awake, alert and oriented x 4. Pain with the dressing change.  Patient reports pain as moderate.    Objective:   VITALS:  Temp:  [97.9 F (36.6 C)-98.2 F (36.8 C)] 97.9 F (36.6 C) (06/19 0500) Pulse Rate:  [70-81] 79 (06/19 0500) Resp:  [16-20] 20 (06/19 0500) BP: (105-121)/(62-75) 121/66 (06/19 0500) SpO2:  [99 %-100 %] 100 % (06/19 0500)  Neurologically intact ABD soft Neurovascular intact Sensation intact distally Intact pulses distally Dorsiflexion/Plantar flexion intact Incision: scant drainage and CRP 6 decreased from 113, WBC 6.0K down from 11K PICCU line right forearm okace yesterday afternoon.   LABS  Recent Labs  06/11/17 1621 06/14/17 0544  HGB 9.9* 9.8*  WBC 9.4 6.0  PLT 256 385    Recent Labs  06/13/17 0453 06/14/17 0544  NA 140 139  K 4.9 4.0  CL 107 106  CO2 27 26  BUN 10 6  CREATININE 0.82 0.66  GLUCOSE 103* 107*   No results for input(s): LABPT, INR in the last 72 hours.   Assessment/Plan: 3 Days Post-Op Procedure(s) (LRB): Incision, Drainage and Debridement of lumbar surgical incision site, possible vac placement (N/A)  Advance diet Up with therapy Continue ABX therapy due to Post-op infection  Likely will be ready for discharge tomorrow post dressing change.  Kerrin ChampagneJames E Nitka 06/14/2017, 8:28 AM Patient ID: Jenean Lindauandall K Swiech, male   DOB: 12-Feb-1956, 61 y.o.   MRN: [id number]

## 2017-06-14 NOTE — Progress Notes (Signed)
[facility]  [facility].  [facility] will be providing HHRN and Home Infusion Pharmacy Services for home IV ABX.  [facility] out to daughter who per pt will support IV ABX administration at home.  [facility] is prepared for DC home when ordered by Dr. Sidney AceNitka/ID team.   If patient discharges after hours, please call [phone].   Sedalia Mutaamela S Chandler 06/14/2017, 8:29 AM

## 2017-06-14 NOTE — Progress Notes (Signed)
Physical Therapy Treatment Patient Details Name: [name] MRN: [id number] DOB: 11-Jun-1956 Today's Date: 06/14/2017    History of Present Illness Pt is a 61 y.o. male presenting with L5-S1 lumbar fusion (4.5 weeks post op) wound infection now s/p I&D with wound vac placement. PMHx: Depression, GERD, HTN, Seizures, bil THA, L rotator cuff repair.    PT Comments    Pt has met all goals and no PT needs remain.  Will inform supervising PT of patient's progress and need to d/c PT services at this time.     Follow Up Recommendations  No PT follow up     Equipment Recommendations  None recommended by PT    Recommendations for Other Services       Precautions / Restrictions Precautions Precautions: Back Precaution Booklet Issued: No Precaution Comments: Pt able to recall 3/3 back precautions from prior education Required Braces or Orthoses: Spinal Brace Spinal Brace: Lumbar corset (Pt remains independent with application including donning and doffing) Restrictions Weight Bearing Restrictions: Yes RLE Weight Bearing: Weight bearing as tolerated LLE Weight Bearing: Weight bearing as tolerated    Mobility  Bed Mobility Overal bed mobility: Modified Independent             General bed mobility comments: Pt performed log rolling sidelying<supine without cueing for assistance.    Transfers Overall transfer level: Modified independent Equipment used: Rolling walker (2 wheeled) Transfers: Sit to/from Stand Sit to Stand: Modified independent (Device/Increase time)         General transfer comment: Good technique, no cueing needed.    Ambulation/Gait Ambulation/Gait assistance: Modified independent (Device/Increase time) Ambulation Distance (Feet): 650 Feet Assistive device: Rolling walker (2 wheeled) Gait Pattern/deviations: Step-through pattern Gait velocity: decreased Gait velocity interpretation: Below normal speed for age/gender General Gait Details: Gait  symmetry improved, with improved cadence.     Stairs Stairs: Yes   Stair Management: Two rails;Alternating pattern Number of Stairs: 12 General stair comments: Good technique.  NO LOB.    Wheelchair Mobility    Modified Rankin (Stroke Patients Only)       Balance Overall balance assessment: No apparent balance deficits (not formally assessed)                                          Cognition Arousal/Alertness: Awake/alert Behavior During Therapy: WFL for tasks assessed/performed Overall Cognitive Status: Within Functional Limits for tasks assessed                                        Exercises      General Comments        Pertinent Vitals/Pain Pain Assessment: 0-10 Pain Score: 5  Pain Location: back Pain Descriptors / Indicators: Sharp Pain Intervention(s): Monitored during session;Repositioned    Home Living                      Prior Function            PT Goals (current goals can now be found in the care plan section) Acute Rehab PT Goals Patient Stated Goal: get better Potential to Achieve Goals: Good Progress towards PT goals: Goals met/education completed, patient discharged from PT    Frequency    Min 5X/week      PT Plan Current  plan remains appropriate    Co-evaluation              AM-PAC PT "6 Clicks" Daily Activity  Outcome Measure  Difficulty turning over in bed (including adjusting bedclothes, sheets and blankets)?: None Difficulty moving from lying on back to sitting on the side of the bed? : None Difficulty sitting down on and standing up from a chair with arms (e.g., wheelchair, bedside commode, etc,.)?: None Help needed moving to and from a bed to chair (including a wheelchair)?: None Help needed walking in hospital room?: None Help needed climbing 3-5 steps with a railing? : None 6 Click Score: 24    End of Session Equipment Utilized During Treatment: Gait belt;Back  brace Activity Tolerance: Patient tolerated treatment well Patient left: in chair;with call bell/phone within reach Nurse Communication: Mobility status PT Visit Diagnosis: Other abnormalities of gait and mobility (R26.89);Pain Pain - Right/Left:  (lower) Pain - part of body:  (lumbar)     Time: 4742-5956 PT Time Calculation (min) (ACUTE ONLY): 31 min  Charges:  $Gait Training: 23-37 mins                    G Codes:       Governor Rooks, PTA pager [phone]    Cristela Blue 06/14/2017, 3:20 PM

## 2017-06-14 NOTE — Progress Notes (Signed)
Physical Therapy Discharge Patient Details Name: [name] MRN: [id number] DOB: 06/10/1956 Today's Date: 06/14/2017 Time: 9643-8381 PT Time Calculation (min) (ACUTE ONLY): 31 min  Patient discharged from PT services secondary to goals met and no further PT needs identified.  Please see latest therapy progress note for current level of functioning and progress toward goals.    Progress and discharge plan discussed with patient and/or caregiver: Patient/Caregiver agrees with plan  GP     [name] Eli Hose 06/14/2017, 3:22 PM   Governor Rooks, PTA pager [phone]

## 2017-06-14 NOTE — Care Management Important Message (Signed)
Important Message  Patient Details  Name: [name] MRN: [id number] Date of Birth: 13-Oct-1956   Medicare Important Message Given:  Yes    [name] 06/14/2017, 11:21 AM

## 2017-06-15 MED ORDER — CEFAZOLIN SODIUM-DEXTROSE 1-4 GM/50ML-% IV SOLN: 1.0000 g | Freq: Three times a day (TID) | INTRAVENOUS | 0 refills | Status: AC

## 2017-06-15 MED ORDER — METHOCARBAMOL 500 MG PO TABS: 500.0000 mg | ORAL_TABLET | Freq: Three times a day (TID) | ORAL | 1 refills | Status: AC | PRN

## 2017-06-15 MED ORDER — DOCUSATE SODIUM 100 MG PO CAPS: 100.0000 mg | ORAL_CAPSULE | Freq: Two times a day (BID) | ORAL | 0 refills | Status: AC

## 2017-06-15 MED ORDER — CELECOXIB 200 MG PO CAPS: 200.0000 mg | ORAL_CAPSULE | Freq: Two times a day (BID) | ORAL | 1 refills | Status: AC

## 2017-06-15 MED ORDER — FERROUS SULFATE 325 (65 FE) MG PO TABS
325.0000 mg | ORAL_TABLET | Freq: Two times a day (BID) | ORAL | 3 refills | Status: AC
Start: 2017-06-15 — End: ?

## 2017-06-15 MED ORDER — CEFAZOLIN IV (FOR PTA / DISCHARGE USE ONLY): 1.0000 g | Freq: Three times a day (TID) | INTRAVENOUS | 0 refills | Status: AC

## 2017-06-15 MED ORDER — OXYCODONE HCL 5 MG PO TABS: 5.0000 mg | ORAL_TABLET | ORAL | 0 refills | Status: DC | PRN

## 2017-06-15 MED ORDER — HEPARIN SOD (PORK) LOCK FLUSH 100 UNIT/ML IV SOLN
250.0000 [IU] | INTRAVENOUS | Status: AC | PRN
  Administered 2017-06-15: 250 [IU]

## 2017-06-15 NOTE — Progress Notes (Signed)
Patient discharge instructions reviewed with patient. Patient verbalizes understanding. Patient's daughter is driving him home. Patient discharged via wheelchair. Patient is not in distress. Patient belongings with patient.

## 2017-06-15 NOTE — Progress Notes (Signed)
     Subjective: 4 Days Post-Op Procedure(s) (LRB): Incision, Drainage and Debridement of lumbar surgical incision site, possible vac placement (N/A) Awake, alert and oriented x 4. No fever or chills ancef for antibiotic coverage MSSA. Patient reports pain as moderate.   Can I have something for in between percocet Objective:   VITALS:  Temp:  [97.4 F (36.3 C)-98.2 F (36.8 C)] 98 F (36.7 C) (06/20 0629) Pulse Rate:  [74-84] 84 (06/20 0629) Resp:  [18-20] 18 (06/20 0629) BP: (113-132)/(64-67) 120/67 (06/20 0629) SpO2:  [99 %-100 %] 100 % (06/20 0629)  Neurologically intact ABD soft Neurovascular intact Sensation intact distally Intact pulses distally Dorsiflexion/Plantar flexion intact Incision: dressing C/D/I and scant drainage No cellulitis present VAC in place and suction intact.   LABS  Recent Labs  06/14/17 0544  HGB 9.8*  WBC 6.0  PLT 385    Recent Labs  06/13/17 0453 06/14/17 0544  NA 140 139  K 4.9 4.0  CL 107 106  CO2 27 26  BUN 10 6  CREATININE 0.82 0.66  GLUCOSE 103* 107*   No results for input(s): LABPT, INR in the last 72 hours.   Assessment/Plan: 4 Days Post-Op Procedure(s) (LRB): Incision, Drainage and Debridement of lumbar surgical incision site, possible vac placement (N/A)  Advance diet Up with therapy Continue ABX therapy due to Post-op infection Discharge home with home health  Can use muscle relaxer or neurotin inbetween percocet, needs to cut back on narcotics as this heals eventually to stop in 3-4 weeks  Daniel ChampagneJames E Lizett Olsen 06/15/2017, 8:43 AM Patient ID: Daniel Lindauandall K Olsen, male   DOB: 1956/09/29, 61 y.o.   MRN: [id number]

## 2017-06-15 NOTE — Discharge Instructions (Signed)
° ° °  Call if there is increasing drainage, fever greater than 101.5, severe head aches, and worsening nausea or light sensitivity. If shortness of breath, bloody cough or chest tightness or pain go to an emergency room. No lifting greater than 10 lbs. Avoid bending, stooping and twisting. Use brace when sitting and out of bed even to go to bathroom. Walk in house for first 2 weeks then may start to get out slowly increasing distances up to one mile by 4-6 weeks post op. After 5 days may shower on day of dressing VAC change before change and change dressing following bathing with shower.When bathing remove the brace shower and replace brace before getting out of the shower. If drainage, keep dry dressing and do not bathe the incision, use an moisture impervious dressing. Please call and return for scheduled follow up appointment 2 weeks from the time of surgery.

## 2017-06-15 NOTE — Progress Notes (Signed)
Pharmacy Antibiotic Note  Jenean LindauRandall K Swarey is a 61 y.o. male admitted on 06/10/2017 with lumbar wound infection.  Noted plans for discharge with home-health.  Please note the following:  IV antibiotic discharge orders are pended. To discharging provider:  please sign these orders via discharge navigator,  Select New Orders & click on the button choice - Manage This Unsigned Work  S/p I&D with deep cxs taken.  Plan: Continue - Ancef 1g IV q8h Follow c/s, clinical progression, renal function   Height: 5\' 8"  (172.7 cm) Weight: 152 lb (68.9 kg) IBW/kg (Calculated) : 68.4  Temp (24hrs), Avg:97.7 F (36.5 C), Min:97.4 F (36.3 C), Max:98 F (36.7 C)   Recent Labs Lab 06/09/17 1431 06/10/17 2008 06/11/17 1621 06/12/17 0429 06/13/17 0453 06/14/17 0544  WBC 10.0 11.0* 9.4  --   --  6.0  CREATININE  --  0.98  --  1.00 0.82 0.66    Estimated Creatinine Clearance: 97.4 mL/min (by C-G formula based on SCr of 0.66 mg/dL).    Allergies  Allergen Reactions  . No Known Allergies    Keflex PTA Vanc 6/15 >> 6/17 Zosyn 6/15 >> 6/16 CTX 6/16 >> 6/17. Ancef 6/17>>  6/16 lumbar abscess - few staph aureus 6/16 back wound - rare staph aureus 6/15 MRSA PCR: neg  Nadara MustardNita Harrington Jobe, PharmD., MS Clinical Pharmacist Pager:  [phone] Thank you for allowing pharmacy to be part of this patients care team. 06/15/2017 2:11 PM

## 2017-06-15 NOTE — Plan of Care (Signed)
Problem: Pain Managment: Goal: General experience of comfort will improve Educated patient on pain scale. Patient voices understanding.

## 2017-06-16 DIAGNOSIS — K219 Gastro-esophageal reflux disease without esophagitis: Secondary | ICD-10-CM | POA: Diagnosis not present

## 2017-06-16 DIAGNOSIS — T814XXA Infection following a procedure, initial encounter: Secondary | ICD-10-CM | POA: Diagnosis not present

## 2017-06-16 DIAGNOSIS — F329 Major depressive disorder, single episode, unspecified: Secondary | ICD-10-CM | POA: Diagnosis not present

## 2017-06-16 DIAGNOSIS — Z452 Encounter for adjustment and management of vascular access device: Secondary | ICD-10-CM | POA: Diagnosis not present

## 2017-06-16 DIAGNOSIS — Z5181 Encounter for therapeutic drug level monitoring: Secondary | ICD-10-CM | POA: Diagnosis not present

## 2017-06-16 DIAGNOSIS — I10 Essential (primary) hypertension: Secondary | ICD-10-CM | POA: Diagnosis not present

## 2017-06-16 LAB — AEROBIC/ANAEROBIC CULTURE W GRAM STAIN (SURGICAL/DEEP WOUND)

## 2017-06-16 LAB — AEROBIC/ANAEROBIC CULTURE (SURGICAL/DEEP WOUND)

## 2017-06-17 DIAGNOSIS — T814XXA Infection following a procedure, initial encounter: Secondary | ICD-10-CM | POA: Diagnosis not present

## 2017-06-17 DIAGNOSIS — I10 Essential (primary) hypertension: Secondary | ICD-10-CM | POA: Diagnosis not present

## 2017-06-17 DIAGNOSIS — Z452 Encounter for adjustment and management of vascular access device: Secondary | ICD-10-CM | POA: Diagnosis not present

## 2017-06-17 DIAGNOSIS — F329 Major depressive disorder, single episode, unspecified: Secondary | ICD-10-CM | POA: Diagnosis not present

## 2017-06-17 DIAGNOSIS — K219 Gastro-esophageal reflux disease without esophagitis: Secondary | ICD-10-CM | POA: Diagnosis not present

## 2017-06-17 DIAGNOSIS — Z5181 Encounter for therapeutic drug level monitoring: Secondary | ICD-10-CM | POA: Diagnosis not present

## 2017-06-19 DIAGNOSIS — F329 Major depressive disorder, single episode, unspecified: Secondary | ICD-10-CM | POA: Diagnosis not present

## 2017-06-19 DIAGNOSIS — K219 Gastro-esophageal reflux disease without esophagitis: Secondary | ICD-10-CM | POA: Diagnosis not present

## 2017-06-19 DIAGNOSIS — T814XXA Infection following a procedure, initial encounter: Secondary | ICD-10-CM | POA: Diagnosis not present

## 2017-06-19 DIAGNOSIS — Z452 Encounter for adjustment and management of vascular access device: Secondary | ICD-10-CM | POA: Diagnosis not present

## 2017-06-19 DIAGNOSIS — Z5181 Encounter for therapeutic drug level monitoring: Secondary | ICD-10-CM | POA: Diagnosis not present

## 2017-06-19 DIAGNOSIS — I10 Essential (primary) hypertension: Secondary | ICD-10-CM | POA: Diagnosis not present

## 2017-06-20 ENCOUNTER — Other Ambulatory Visit (INDEPENDENT_AMBULATORY_CARE_PROVIDER_SITE_OTHER): Payer: Self-pay

## 2017-06-20 DIAGNOSIS — R700 Elevated erythrocyte sedimentation rate: Secondary | ICD-10-CM | POA: Diagnosis not present

## 2017-06-20 DIAGNOSIS — Z5181 Encounter for therapeutic drug level monitoring: Secondary | ICD-10-CM | POA: Diagnosis not present

## 2017-06-20 DIAGNOSIS — Z452 Encounter for adjustment and management of vascular access device: Secondary | ICD-10-CM | POA: Diagnosis not present

## 2017-06-20 DIAGNOSIS — F329 Major depressive disorder, single episode, unspecified: Secondary | ICD-10-CM | POA: Diagnosis not present

## 2017-06-20 DIAGNOSIS — K219 Gastro-esophageal reflux disease without esophagitis: Secondary | ICD-10-CM | POA: Diagnosis not present

## 2017-06-20 DIAGNOSIS — I10 Essential (primary) hypertension: Secondary | ICD-10-CM | POA: Diagnosis not present

## 2017-06-20 DIAGNOSIS — T814XXA Infection following a procedure, initial encounter: Secondary | ICD-10-CM | POA: Diagnosis not present

## 2017-06-20 NOTE — Addendum Note (Signed)
Addended by: Penne LashSHUE WILLS, Neysa BonitoHRISTY N on: 06/20/2017 11:02 AM   Modules accepted: Orders

## 2017-06-20 NOTE — Telephone Encounter (Signed)
Patient would like a Rx refill on Oxycodone. CB# is [phone].  Please Advise.  Thank You.

## 2017-06-21 ENCOUNTER — Telehealth (INDEPENDENT_AMBULATORY_CARE_PROVIDER_SITE_OTHER): Payer: Self-pay | Admitting: Radiology

## 2017-06-21 NOTE — Telephone Encounter (Signed)
Trey PaulaJeff with HH called, requested orders for HHPT once a week for 4 weeks, gait training, strengthening, I advised ok for these orders.

## 2017-06-22 ENCOUNTER — Encounter (INDEPENDENT_AMBULATORY_CARE_PROVIDER_SITE_OTHER): Payer: Self-pay | Admitting: Specialist

## 2017-06-22 ENCOUNTER — Ambulatory Visit (INDEPENDENT_AMBULATORY_CARE_PROVIDER_SITE_OTHER): Payer: Medicare Other

## 2017-06-22 ENCOUNTER — Ambulatory Visit (INDEPENDENT_AMBULATORY_CARE_PROVIDER_SITE_OTHER): Payer: Medicare Other | Admitting: Specialist

## 2017-06-22 VITALS — BP 130/85 | HR 111 | Ht 68.0 in | Wt 152.0 lb

## 2017-06-22 DIAGNOSIS — I10 Essential (primary) hypertension: Secondary | ICD-10-CM | POA: Diagnosis not present

## 2017-06-22 DIAGNOSIS — F329 Major depressive disorder, single episode, unspecified: Secondary | ICD-10-CM | POA: Diagnosis not present

## 2017-06-22 DIAGNOSIS — K219 Gastro-esophageal reflux disease without esophagitis: Secondary | ICD-10-CM | POA: Diagnosis not present

## 2017-06-22 DIAGNOSIS — Z981 Arthrodesis status: Secondary | ICD-10-CM

## 2017-06-22 DIAGNOSIS — Z452 Encounter for adjustment and management of vascular access device: Secondary | ICD-10-CM | POA: Diagnosis not present

## 2017-06-22 DIAGNOSIS — T814XXA Infection following a procedure, initial encounter: Secondary | ICD-10-CM | POA: Diagnosis not present

## 2017-06-22 DIAGNOSIS — Z5181 Encounter for therapeutic drug level monitoring: Secondary | ICD-10-CM | POA: Diagnosis not present

## 2017-06-22 DIAGNOSIS — T814XXD Infection following a procedure, subsequent encounter: Secondary | ICD-10-CM

## 2017-06-22 DIAGNOSIS — IMO0001 Reserved for inherently not codable concepts without codable children: Secondary | ICD-10-CM

## 2017-06-22 MED ORDER — OXYCODONE HCL 5 MG PO TABS: 5.0000 mg | ORAL_TABLET | ORAL | 0 refills | Status: DC | PRN

## 2017-06-22 NOTE — Patient Instructions (Signed)
Avoid frequent bending and stooping  No lifting greater than 10 lbs. May use ice or moist heat for pain. Weight loss is of benefit. Handicap license is approved.  Please ask therapy and HOME Health if we can change dressing in the office with wet to dry and have them change to  [facility] the day of his normal VAC routine change. Next Friday, 07/01/2017.

## 2017-06-22 NOTE — Telephone Encounter (Signed)
Seen in office today and Rx renewed. [facility] web site reviewed, will need to cut back on his narcotics, for now decreasing from 10mg  to 5 mg strength.  jen

## 2017-06-22 NOTE — Progress Notes (Signed)
Post-Op Visit Note   Patient: [name]           Date of Birth: 1956-05-05           MRN: [id number] Visit Date: 06/22/2017 PCP: Simone Curia, MD   Assessment & Plan: 11 days  following Incision and drainage of a deep incision staph a, MSSA  Infected surgical incision. Day # 11 of antibiotic treatment, initially vancomycin now for 8 days ancef. Chief Complaint:  Chief Complaint  Patient presents with  . Lower Back - Routine Post Op   Visit Diagnoses:  1. Status post lumbar spinal fusion   2. Deep incisional surgical site infection, subsequent encounter   Incision without erythrema, VAC in place will ask permission to change in 9 days.  Motor normal both legs,SLR neg. Radiographs today without abnormality.  Plan: Continue with home antibiotics IV, giving it 3 times per day. Continue with VAC dressing changes Via HHN. Try to schedule appointment in the office to coincide with VAC day change so that the dressing may be removed and A saline wet to dry dressing applied till [facility] can per VAC application. I will see him back in one week.  Avoid frequent bending and stooping  No lifting greater than 10 lbs. May use ice or moist heat for pain. Weight loss is of benefit. Handicap license is approved.  Please ask therapy and HOME Health if we can change dressing in the office with wet to dry and have them change to  [facility] later the day of his normal VAC routine change. Next Friday, 07/01/2017. Follow-Up Instructions: Return in about 9 weeks (around 08/24/2017) for Wound inspection.   Orders:  Orders Placed This Encounter  Procedures  . XR Lumbar Spine 2-3 Views   Meds ordered this encounter  Medications  . oxyCODONE (OXY IR/ROXICODONE) 5 MG immediate release tablet    Sig: Take 1-2 tablets (5-10 mg total) by mouth every 4 (four) hours as needed for breakthrough pain.    Dispense:  60 tablet    Refill:  0    Imaging: Xr Lumbar Spine 2-3 Views  Result Date: 06/22/2017 AP and  lateral radiographs of the lumbar spine with cage and bone graft within the intervertebral disc space L5-S1, hardware without abnormality. No change since last radiographs 5/25.    PMFS History: Patient Active Problem List   Diagnosis Date Noted  . Deep incisional surgical site infection 06/10/2017    Priority: High  . Spinal stenosis of lumbar region 05/09/2017    Priority: High    Class: Chronic  . Status post lumbar spinal fusion 06/10/2017    Priority: Medium    Class: Acute  . Localization-related idiopathic epilepsy and epileptic syndromes with seizures of localized onset, not intractable, with status epilepticus (HCC) 03/31/2017  . Medication monitoring encounter 03/31/2017  . Drug induced subacute dyskinesia 03/01/2017  . Seizure disorder (HCC) 03/01/2017  . Spinal stenosis of lumbar region with neurogenic claudication 03/01/2017  . Sciatica of left side 03/01/2017  . 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 Acute left-sided low back pain with left-sided sciatica 12/02/2016  . Avascular necrosis of hip (HCC) 02/05/2015   Past Medical History:  Diagnosis Date  . Depression   . GERD (gastroesophageal reflux disease)    OTC meds  . Headache   . History of kidney stones   . Hypertension   . Seizures (HCC)    several months since the last surgery    No family  history on file.  Past Surgical History:  Procedure Laterality Date  . JOINT REPLACEMENT Bilateral    hip right and left  . LUMBAR LAMINECTOMY N/A 06/11/2017   Procedure: Incision, Drainage and Debridement of lumbar surgical incision site, possible vac placement;  Surgeon: Kerrin ChampagneNitka, Eboni Coval E, MD;  Location: [facility] OR;  Service: Orthopedics;  Laterality: N/A;  . ROTATOR CUFF REPAIR Left   . TOTAL HIP ARTHROPLASTY Left 02/05/2015   Procedure: TOTAL HIP ARTHROPLASTY;  Surgeon: Nadara MustardMarcus Duda V, MD;  Location: [facility] OR;  Service: Orthopedics;  Laterality: Left;  . TRANSFORAMINAL LUMBAR INTERBODY FUSION (TLIF) WITH  PEDICLE SCREW FIXATION 1 LEVEL  05/09/2017   TLIF fusion L5-S1 - Dr. Otelia SergeantNitka   Social History   Occupational History  . Not on file.   Social History Main Topics  . Smoking status: Never Smoker  . Smokeless tobacco: Never Used  . Alcohol use 2.4 oz/week    4 Cans of beer per week  . Drug use: No  . Sexual activity: Not on file

## 2017-06-23 DIAGNOSIS — Z452 Encounter for adjustment and management of vascular access device: Secondary | ICD-10-CM | POA: Diagnosis not present

## 2017-06-23 DIAGNOSIS — K219 Gastro-esophageal reflux disease without esophagitis: Secondary | ICD-10-CM | POA: Diagnosis not present

## 2017-06-23 DIAGNOSIS — F329 Major depressive disorder, single episode, unspecified: Secondary | ICD-10-CM | POA: Diagnosis not present

## 2017-06-23 DIAGNOSIS — I10 Essential (primary) hypertension: Secondary | ICD-10-CM | POA: Diagnosis not present

## 2017-06-23 DIAGNOSIS — T814XXA Infection following a procedure, initial encounter: Secondary | ICD-10-CM | POA: Diagnosis not present

## 2017-06-23 DIAGNOSIS — Z5181 Encounter for therapeutic drug level monitoring: Secondary | ICD-10-CM | POA: Diagnosis not present

## 2017-06-25 DIAGNOSIS — T814XXA Infection following a procedure, initial encounter: Secondary | ICD-10-CM | POA: Diagnosis not present

## 2017-06-25 DIAGNOSIS — I10 Essential (primary) hypertension: Secondary | ICD-10-CM | POA: Diagnosis not present

## 2017-06-25 DIAGNOSIS — Z452 Encounter for adjustment and management of vascular access device: Secondary | ICD-10-CM | POA: Diagnosis not present

## 2017-06-25 DIAGNOSIS — F329 Major depressive disorder, single episode, unspecified: Secondary | ICD-10-CM | POA: Diagnosis not present

## 2017-06-25 DIAGNOSIS — Z5181 Encounter for therapeutic drug level monitoring: Secondary | ICD-10-CM | POA: Diagnosis not present

## 2017-06-25 DIAGNOSIS — K219 Gastro-esophageal reflux disease without esophagitis: Secondary | ICD-10-CM | POA: Diagnosis not present

## 2017-06-27 ENCOUNTER — Telehealth (INDEPENDENT_AMBULATORY_CARE_PROVIDER_SITE_OTHER): Payer: Self-pay | Admitting: *Deleted

## 2017-06-27 ENCOUNTER — Other Ambulatory Visit (INDEPENDENT_AMBULATORY_CARE_PROVIDER_SITE_OTHER): Payer: Self-pay | Admitting: Radiology

## 2017-06-27 DIAGNOSIS — B999 Unspecified infectious disease: Secondary | ICD-10-CM | POA: Diagnosis not present

## 2017-06-27 DIAGNOSIS — Z5181 Encounter for therapeutic drug level monitoring: Secondary | ICD-10-CM | POA: Diagnosis not present

## 2017-06-27 DIAGNOSIS — Z452 Encounter for adjustment and management of vascular access device: Secondary | ICD-10-CM | POA: Diagnosis not present

## 2017-06-27 DIAGNOSIS — I10 Essential (primary) hypertension: Secondary | ICD-10-CM | POA: Diagnosis not present

## 2017-06-27 DIAGNOSIS — T814XXA Infection following a procedure, initial encounter: Secondary | ICD-10-CM | POA: Diagnosis not present

## 2017-06-27 DIAGNOSIS — K219 Gastro-esophageal reflux disease without esophagitis: Secondary | ICD-10-CM | POA: Diagnosis not present

## 2017-06-27 DIAGNOSIS — Z981 Arthrodesis status: Secondary | ICD-10-CM

## 2017-06-27 DIAGNOSIS — IMO0001 Reserved for inherently not codable concepts without codable children: Secondary | ICD-10-CM

## 2017-06-27 DIAGNOSIS — F329 Major depressive disorder, single episode, unspecified: Secondary | ICD-10-CM | POA: Diagnosis not present

## 2017-06-27 DIAGNOSIS — T814XXD Infection following a procedure, subsequent encounter: Secondary | ICD-10-CM

## 2017-06-27 MED ORDER — OXYCODONE HCL 5 MG PO TABS: 5.0000 mg | ORAL_TABLET | ORAL | 0 refills | Status: DC | PRN

## 2017-06-27 NOTE — Telephone Encounter (Signed)
Pt called stating he has court date tomorrow am. He is needing a letter faxed stating he has had surgery. I advised pt this may not get done today since it is late. FAX: [phone] 

## 2017-06-27 NOTE — Telephone Encounter (Signed)
Patient wants refill on oxycodone.  States wants to pick up Rx by Tuesday due to office being closed on Wednesday and patient will be out of medication.

## 2017-06-27 NOTE — Telephone Encounter (Signed)
Pt called stating he has court date tomorrow am. He is needing a letter faxed stating he has had surgery. I advised pt this may not get done today since it is late. FAX: [phone]

## 2017-06-27 NOTE — Telephone Encounter (Signed)
Patient is aware that his rx is ready for pick up at the front desk 

## 2017-06-29 DIAGNOSIS — I10 Essential (primary) hypertension: Secondary | ICD-10-CM | POA: Diagnosis not present

## 2017-06-29 DIAGNOSIS — Z5181 Encounter for therapeutic drug level monitoring: Secondary | ICD-10-CM | POA: Diagnosis not present

## 2017-06-29 DIAGNOSIS — Z452 Encounter for adjustment and management of vascular access device: Secondary | ICD-10-CM | POA: Diagnosis not present

## 2017-06-29 DIAGNOSIS — F329 Major depressive disorder, single episode, unspecified: Secondary | ICD-10-CM | POA: Diagnosis not present

## 2017-06-29 DIAGNOSIS — K219 Gastro-esophageal reflux disease without esophagitis: Secondary | ICD-10-CM | POA: Diagnosis not present

## 2017-06-29 DIAGNOSIS — T814XXA Infection following a procedure, initial encounter: Secondary | ICD-10-CM | POA: Diagnosis not present

## 2017-06-30 ENCOUNTER — Other Ambulatory Visit (INDEPENDENT_AMBULATORY_CARE_PROVIDER_SITE_OTHER): Payer: Self-pay | Admitting: Specialist

## 2017-06-30 NOTE — Telephone Encounter (Signed)
Letter is in the chart. jen

## 2017-07-01 ENCOUNTER — Ambulatory Visit (INDEPENDENT_AMBULATORY_CARE_PROVIDER_SITE_OTHER): Payer: Medicare Other | Admitting: Specialist

## 2017-07-01 ENCOUNTER — Encounter (INDEPENDENT_AMBULATORY_CARE_PROVIDER_SITE_OTHER): Payer: Self-pay | Admitting: Specialist

## 2017-07-01 VITALS — BP 107/68 | HR 92 | Ht 68.0 in | Wt 152.0 lb

## 2017-07-01 DIAGNOSIS — Z452 Encounter for adjustment and management of vascular access device: Secondary | ICD-10-CM | POA: Diagnosis not present

## 2017-07-01 DIAGNOSIS — F329 Major depressive disorder, single episode, unspecified: Secondary | ICD-10-CM | POA: Diagnosis not present

## 2017-07-01 DIAGNOSIS — T814XXD Infection following a procedure, subsequent encounter: Secondary | ICD-10-CM

## 2017-07-01 DIAGNOSIS — IMO0001 Reserved for inherently not codable concepts without codable children: Secondary | ICD-10-CM

## 2017-07-01 DIAGNOSIS — I10 Essential (primary) hypertension: Secondary | ICD-10-CM | POA: Diagnosis not present

## 2017-07-01 DIAGNOSIS — T814XXA Infection following a procedure, initial encounter: Secondary | ICD-10-CM | POA: Diagnosis not present

## 2017-07-01 DIAGNOSIS — Z5181 Encounter for therapeutic drug level monitoring: Secondary | ICD-10-CM | POA: Diagnosis not present

## 2017-07-01 DIAGNOSIS — Z981 Arthrodesis status: Secondary | ICD-10-CM

## 2017-07-01 DIAGNOSIS — K219 Gastro-esophageal reflux disease without esophagitis: Secondary | ICD-10-CM | POA: Diagnosis not present

## 2017-07-01 MED ORDER — OXYCODONE HCL 5 MG PO TABS: 5.0000 mg | ORAL_TABLET | Freq: Four times a day (QID) | ORAL | 0 refills | Status: DC | PRN

## 2017-07-01 NOTE — Telephone Encounter (Signed)
Note printed and given to patient at appt today

## 2017-07-01 NOTE — Progress Notes (Signed)
   Post-Op Visit Note   Patient: [name]           Date of Birth: 14-Sep-1956           MRN: [id number] Visit Date: 07/01/2017 PCP: Simone CuriaLee, Keung, MD   Assessment & Plan:2 week post I&D of lumbar incision for deep muscle layer infection with MSSA. VAC intact, dressing today with NS wet to dry, will have HHN change back to a VAC.   Chief Complaint:  Chief Complaint  Patient presents with  . Lower Back - Follow-up   Visit Diagnoses:  1. Status post lumbar spinal fusion   2. Deep incisional surgical site infection, subsequent encounter     Plan:Avoid frequent bending and stooping  No lifting greater than 10 lbs. May use ice or moist heat for pain. Weight loss is of benefit. Handicap license is approved.  Follow-Up Instructions: Return in about 2 weeks (around 07/15/2017).   Orders:  No orders of the defined types were placed in this encounter.  No orders of the defined types were placed in this encounter.   Imaging: No results found.  PMFS History: Patient Active Problem List   Diagnosis Date Noted  . Deep incisional surgical site infection 06/10/2017    Priority: High  . Spinal stenosis of lumbar region 05/09/2017    Priority: High    Class: Chronic  . Status post lumbar spinal fusion 06/10/2017    Priority: Medium    Class: Acute  . Localization-related idiopathic epilepsy and epileptic syndromes with seizures of localized onset, not intractable, with status epilepticus (HCC) 03/31/2017  . Medication monitoring encounter 03/31/2017  . Drug induced subacute dyskinesia 03/01/2017  . Seizure disorder (HCC) 03/01/2017  . Spinal stenosis of lumbar region with neurogenic claudication 03/01/2017  . Sciatica of left side 03/01/2017  . 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 Acute left-sided low back pain with left-sided sciatica 12/02/2016  . Avascular necrosis of hip (HCC) 02/05/2015   Past Medical History:  Diagnosis Date  . Depression     . GERD (gastroesophageal reflux disease)    OTC meds  . Headache   . History of kidney stones   . Hypertension   . Seizures (HCC)    several months since the last surgery    No family history on file.  Past Surgical History:  Procedure Laterality Date  . JOINT REPLACEMENT Bilateral    hip right and left  . LUMBAR LAMINECTOMY N/A 06/11/2017   Procedure: Incision, Drainage and Debridement of lumbar surgical incision site, possible vac placement;  Surgeon: Kerrin ChampagneNitka, Jimmie Dattilio E, MD;  Location: [facility] OR;  Service: Orthopedics;  Laterality: N/A;  . ROTATOR CUFF REPAIR Left   . TOTAL HIP ARTHROPLASTY Left 02/05/2015   Procedure: TOTAL HIP ARTHROPLASTY;  Surgeon: Nadara MustardMarcus Duda V, MD;  Location: [facility] OR;  Service: Orthopedics;  Laterality: Left;  . TRANSFORAMINAL LUMBAR INTERBODY FUSION (TLIF) WITH PEDICLE SCREW FIXATION 1 LEVEL  05/09/2017   TLIF fusion L5-S1 - Dr. Otelia SergeantNitka   Social History   Occupational History  . Not on file.   Social History Main Topics  . Smoking status: Never Smoker  . Smokeless tobacco: Never Used  . Alcohol use 2.4 oz/week    4 Cans of beer per week  . Drug use: No  . Sexual activity: Not on file

## 2017-07-01 NOTE — Patient Instructions (Signed)
Avoid frequent bending and stooping  No lifting greater than 10 lbs. May use ice or moist heat for pain. Weight loss is of benefit. Handicap license is approved.   

## 2017-07-04 ENCOUNTER — Telehealth (INDEPENDENT_AMBULATORY_CARE_PROVIDER_SITE_OTHER): Payer: Self-pay | Admitting: Specialist

## 2017-07-04 DIAGNOSIS — T814XXA Infection following a procedure, initial encounter: Secondary | ICD-10-CM | POA: Diagnosis not present

## 2017-07-04 DIAGNOSIS — Z5181 Encounter for therapeutic drug level monitoring: Secondary | ICD-10-CM | POA: Diagnosis not present

## 2017-07-04 DIAGNOSIS — I10 Essential (primary) hypertension: Secondary | ICD-10-CM | POA: Diagnosis not present

## 2017-07-04 DIAGNOSIS — F329 Major depressive disorder, single episode, unspecified: Secondary | ICD-10-CM | POA: Diagnosis not present

## 2017-07-04 DIAGNOSIS — Z452 Encounter for adjustment and management of vascular access device: Secondary | ICD-10-CM | POA: Diagnosis not present

## 2017-07-04 DIAGNOSIS — K219 Gastro-esophageal reflux disease without esophagitis: Secondary | ICD-10-CM | POA: Diagnosis not present

## 2017-07-04 NOTE — Telephone Encounter (Signed)
PT STATED HE FOUND THIS, FYI.

## 2017-07-04 NOTE — Telephone Encounter (Signed)
[facility]ill [facility] called saying that she was unable to get patients blood, needing another order to get blood. Insertion pick line site was exposed and red. CB # [phone]

## 2017-07-04 NOTE — Telephone Encounter (Signed)
Ok,noted

## 2017-07-04 NOTE — Telephone Encounter (Signed)
Patient called advised he has lost his Rx for (Oxycodone 5 mg)  The number to contact patient is [phone]

## 2017-07-05 ENCOUNTER — Ambulatory Visit: Payer: Medicare Other | Admitting: Internal Medicine

## 2017-07-06 DIAGNOSIS — K219 Gastro-esophageal reflux disease without esophagitis: Secondary | ICD-10-CM | POA: Diagnosis not present

## 2017-07-06 DIAGNOSIS — Z5181 Encounter for therapeutic drug level monitoring: Secondary | ICD-10-CM | POA: Diagnosis not present

## 2017-07-06 DIAGNOSIS — I10 Essential (primary) hypertension: Secondary | ICD-10-CM | POA: Diagnosis not present

## 2017-07-06 DIAGNOSIS — Z452 Encounter for adjustment and management of vascular access device: Secondary | ICD-10-CM | POA: Diagnosis not present

## 2017-07-06 DIAGNOSIS — T814XXA Infection following a procedure, initial encounter: Secondary | ICD-10-CM | POA: Diagnosis not present

## 2017-07-06 DIAGNOSIS — F329 Major depressive disorder, single episode, unspecified: Secondary | ICD-10-CM | POA: Diagnosis not present

## 2017-07-07 DIAGNOSIS — Z5181 Encounter for therapeutic drug level monitoring: Secondary | ICD-10-CM | POA: Diagnosis not present

## 2017-07-07 DIAGNOSIS — Z452 Encounter for adjustment and management of vascular access device: Secondary | ICD-10-CM | POA: Diagnosis not present

## 2017-07-07 DIAGNOSIS — K219 Gastro-esophageal reflux disease without esophagitis: Secondary | ICD-10-CM | POA: Diagnosis not present

## 2017-07-07 DIAGNOSIS — I10 Essential (primary) hypertension: Secondary | ICD-10-CM | POA: Diagnosis not present

## 2017-07-07 DIAGNOSIS — T814XXA Infection following a procedure, initial encounter: Secondary | ICD-10-CM | POA: Diagnosis not present

## 2017-07-07 DIAGNOSIS — F329 Major depressive disorder, single episode, unspecified: Secondary | ICD-10-CM | POA: Diagnosis not present

## 2017-07-07 NOTE — Telephone Encounter (Signed)
[facility]ill from [facility] called saying that she was unable to get patients blood, needing another order to get blood. Insertion pick line site was exposed and red.---Please advise on what labs they are suppose to be getting?

## 2017-07-08 DIAGNOSIS — Z5181 Encounter for therapeutic drug level monitoring: Secondary | ICD-10-CM | POA: Diagnosis not present

## 2017-07-08 DIAGNOSIS — T814XXA Infection following a procedure, initial encounter: Secondary | ICD-10-CM | POA: Diagnosis not present

## 2017-07-08 DIAGNOSIS — Z452 Encounter for adjustment and management of vascular access device: Secondary | ICD-10-CM | POA: Diagnosis not present

## 2017-07-08 DIAGNOSIS — F329 Major depressive disorder, single episode, unspecified: Secondary | ICD-10-CM | POA: Diagnosis not present

## 2017-07-08 DIAGNOSIS — I10 Essential (primary) hypertension: Secondary | ICD-10-CM | POA: Diagnosis not present

## 2017-07-08 DIAGNOSIS — K219 Gastro-esophageal reflux disease without esophagitis: Secondary | ICD-10-CM | POA: Diagnosis not present

## 2017-07-11 ENCOUNTER — Other Ambulatory Visit (INDEPENDENT_AMBULATORY_CARE_PROVIDER_SITE_OTHER): Payer: Self-pay | Admitting: Specialist

## 2017-07-11 DIAGNOSIS — K219 Gastro-esophageal reflux disease without esophagitis: Secondary | ICD-10-CM | POA: Diagnosis not present

## 2017-07-11 DIAGNOSIS — T814XXD Infection following a procedure, subsequent encounter: Secondary | ICD-10-CM

## 2017-07-11 DIAGNOSIS — Z5181 Encounter for therapeutic drug level monitoring: Secondary | ICD-10-CM | POA: Diagnosis not present

## 2017-07-11 DIAGNOSIS — F329 Major depressive disorder, single episode, unspecified: Secondary | ICD-10-CM | POA: Diagnosis not present

## 2017-07-11 DIAGNOSIS — Z981 Arthrodesis status: Secondary | ICD-10-CM

## 2017-07-11 DIAGNOSIS — I10 Essential (primary) hypertension: Secondary | ICD-10-CM | POA: Diagnosis not present

## 2017-07-11 DIAGNOSIS — T814XXA Infection following a procedure, initial encounter: Secondary | ICD-10-CM | POA: Diagnosis not present

## 2017-07-11 DIAGNOSIS — IMO0001 Reserved for inherently not codable concepts without codable children: Secondary | ICD-10-CM

## 2017-07-11 DIAGNOSIS — Z452 Encounter for adjustment and management of vascular access device: Secondary | ICD-10-CM | POA: Diagnosis not present

## 2017-07-11 NOTE — Telephone Encounter (Signed)
PT REQUESTS A REFILL OF PAIN MEDS PLEASE.  [phone]

## 2017-07-12 NOTE — Discharge Summary (Addendum)
Physician Discharge Summary      Patient ID: [name] MRN: [id number] DOB/AGE: 04-05-1956 61 y.o.  Admit date: 06/10/2017 Discharge date: 06/15/2017  Admission Diagnoses:  Principal Problem:   Deep incisional surgical site infection Active Problems:   Status post lumbar spinal fusion   Discharge Diagnoses:  Same  Past Medical History:  Diagnosis Date  . Depression   . GERD (gastroesophageal reflux disease)    OTC meds  . Headache   . History of kidney stones   . Hypertension   . Seizures ([place])    several months since the last surgery    Surgeries: Procedure(s): Incision, Drainage and Debridement of lumbar surgical incision site, possible vac placement on 06/10/2017 - 06/11/2017   Consultants: Dr. Cira Servant, Infectious Disease.  Discharged Condition: Improved  Hospital Course: [name] is an 61 y.o. male who was admitted 06/10/2017 with a chief complaint of No chief complaint on file. , and found to have a diagnosis of Deep incisional surgical site infection.  He was brought to the operating room on 06/10/2017 - 06/11/2017 and underwent the above named procedures.    He was given perioperative antibiotics:  Anti-infectives    Start     Dose/Rate Route Frequency Ordered Stop   06/15/17 0000  ceFAZolin (ANCEF) IVPB     1 g Intravenous Every 8 hours 06/15/17 0856 07/11/17 2359   06/15/17 0000  ceFAZolin (ANCEF) 1-4 GM/50ML-% SOLN     1 g 100 mL/hr over 30 Minutes Intravenous Every 8 hours 06/15/17 0856 07/08/17 2359   06/12/17 1600  ceFAZolin (ANCEF) IVPB 1 g/50 mL premix  Status:  Discontinued     1 g 100 mL/hr over 30 Minutes Intravenous Every 8 hours 06/12/17 1521 06/15/17 2026   06/11/17 1615  cefTRIAXone (ROCEPHIN) 2 g in dextrose 5 % 50 mL IVPB  Status:  Discontinued     2 g 100 mL/hr over 30 Minutes Intravenous Every 24 hours 06/11/17 1611 06/12/17 1519   06/11/17 1143  polymyxin B 500,000 Units, bacitracin 50,000 Units in sodium chloride  irrigation 0.9 % 500 mL irrigation  Status:  Discontinued       As needed 06/11/17 1143 06/11/17 1528   06/10/17 1800  vancomycin (VANCOCIN) 1,250 mg in sodium chloride 0.9 % 250 mL IVPB  Status:  Discontinued     1,250 mg 166.7 mL/hr over 90 Minutes Intravenous Every 12 hours 06/10/17 1657 06/12/17 1519   06/10/17 1800  piperacillin-tazobactam (ZOSYN) IVPB 3.375 g  Status:  Discontinued     3.375 g 12.5 mL/hr over 240 Minutes Intravenous Every 8 hours 06/10/17 1657 06/11/17 1611    [name] was seen in [facility]'s office and evaluated for new onset of drainage from an operative incision site post Lumbar fusion 05/11/2017. Laboratory data and clinical appearance suggested surgical site infection infection with elevated sed rate and CRP nearly 4 1/2 weeks post surgery. He was admitted on 06/10/2017 and placed on IV antibiotics of vancomycin and zosyn. Taken to the OR on 06/11/2017 and underwent formal incision, drainage and debridement of the lumbar infected incision site. Culture reports Preliminary showed gram positive cocci, likely staph a. Along with incisional debridement and drainage a VAC was placed to 125 mm HG vacuum. Consultation with Infectious disease, Dr. Linus Salmons and the Wound care team. The antibiotic coverage with  Shifted to vancomycin and ceftraixone.  Evenually culture sensitivity allow for change to ancef alone. POD #1 CRP and WBC in CBC were improved. ID  consultation recommend a  Full 6 week course of antibiotics. The VAC was changed on POD #2 06/13/2017 with the wound care team assistance. A PICCU line was installed and the patient was transitioned to ancef 1 gram every 8 hours postop.Pst hospitalization arrangemets were made with [facility] for PICCU care and IV antibiotic therapy POD#3. He remained afebrile and VSS. POD#4. The VAC dressing was changed by the [facility] Nursing staff, [place]. Arrangements for [facility] and wound care along with IV antibiotic treatment and PICCU line care  was  Completed and [name] was discharged home to follow up in our office at [facility] in one week. He  was placed for 6 weeks course of IV antibiotics.   He was given sequential compression devices, early ambulation, and chemoprophylaxis for DVT prophylaxis.  He benefited maximally from their hospital stay and there were no complications.    Recent vital signs:  Vitals:   06/15/17 0629 06/15/17 1300  BP: 120/67 100/79  Pulse: 84 91  Resp: 18 18  Temp: 98 F (36.7 C) 97.8 F (36.6 C)    Recent laboratory studies:  Results for orders placed or performed during the hospital encounter of 06/10/17  MRSA PCR Screening  Result Value Ref Range   MRSA by PCR NEGATIVE NEGATIVE  Aerobic/Anaerobic Culture (surgical/deep wound)  Result Value Ref Range   Specimen Description WOUND BACK    Special Requests LUMBAR INCISION ABSCESS    Gram Stain      MODERATE WBC PRESENT, PREDOMINANTLY PMN RARE GRAM POSITIVE COCCI IN PAIRS IN SINGLES    Culture      RARE STAPHYLOCOCCUS AUREUS NO ANAEROBES ISOLATED    Report Status 06/16/2017 FINAL    Organism ID, Bacteria STAPHYLOCOCCUS AUREUS       Susceptibility   Staphylococcus aureus - MIC*    CIPROFLOXACIN <=0.5 SENSITIVE Sensitive     ERYTHROMYCIN 0.5 SENSITIVE Sensitive     GENTAMICIN <=0.5 SENSITIVE Sensitive     OXACILLIN 0.5 SENSITIVE Sensitive     TETRACYCLINE <=1 SENSITIVE Sensitive     VANCOMYCIN <=0.5 SENSITIVE Sensitive     TRIMETH/SULFA <=10 SENSITIVE Sensitive     CLINDAMYCIN <=0.25 SENSITIVE Sensitive     RIFAMPIN <=0.5 SENSITIVE Sensitive     Inducible Clindamycin NEGATIVE Sensitive     * RARE STAPHYLOCOCCUS AUREUS  Aerobic/Anaerobic Culture (surgical/deep wound)  Result Value Ref Range   Specimen Description ABSCESS LUMBAR INCISION TISSUE    Special Requests NONE    Gram Stain      ABUNDANT WBC PRESENT,BOTH PMN AND MONONUCLEAR NO ORGANISMS SEEN    Culture      FEW STAPHYLOCOCCUS AUREUS CRITICAL RESULT  CALLED TO, READ BACK BY AND VERIFIED WITH: K ADAMS,RN AT 1149 06/12/17 BY L BENFIELD CONCERNING GROWTH ON CULTURE NO ANAEROBES ISOLATED    Report Status 06/16/2017 FINAL    Organism ID, Bacteria STAPHYLOCOCCUS AUREUS       Susceptibility   Staphylococcus aureus - MIC*    CIPROFLOXACIN <=0.5 SENSITIVE Sensitive     ERYTHROMYCIN <=0.25 SENSITIVE Sensitive     GENTAMICIN <=0.5 SENSITIVE Sensitive     OXACILLIN 0.5 SENSITIVE Sensitive     TETRACYCLINE <=1 SENSITIVE Sensitive     VANCOMYCIN <=0.5 SENSITIVE Sensitive     TRIMETH/SULFA <=10 SENSITIVE Sensitive     CLINDAMYCIN <=0.25 SENSITIVE Sensitive     RIFAMPIN <=0.5 SENSITIVE Sensitive     Inducible Clindamycin NEGATIVE Sensitive     * FEW STAPHYLOCOCCUS AUREUS  CBC WITH DIFFERENTIAL  Result  Value Ref Range   WBC 11.0 (H) 4.0 - 10.5 K/uL   RBC 3.39 (L) 4.22 - 5.81 MIL/uL   Hemoglobin 10.9 (L) 13.0 - 17.0 g/dL   HCT 32.9 (L) 39.0 - 52.0 %   MCV 97.1 78.0 - 100.0 fL   MCH 32.2 26.0 - 34.0 pg   MCHC 33.1 30.0 - 36.0 g/dL   RDW 13.9 11.5 - 15.5 %   Platelets 240 150 - 400 K/uL   Neutrophils Relative % 78 %   Neutro Abs 8.6 (H) 1.7 - 7.7 K/uL   Lymphocytes Relative 12 %   Lymphs Abs 1.3 0.7 - 4.0 K/uL   Monocytes Relative 8 %   Monocytes Absolute 0.9 0.1 - 1.0 K/uL   Eosinophils Relative 2 %   Eosinophils Absolute 0.2 0.0 - 0.7 K/uL   Basophils Relative 0 %   Basophils Absolute 0.0 0.0 - 0.1 K/uL  Comprehensive metabolic panel  Result Value Ref Range   Sodium 131 (L) 135 - 145 mmol/L   Potassium 5.1 3.5 - 5.1 mmol/L   Chloride 98 (L) 101 - 111 mmol/L   CO2 26 22 - 32 mmol/L   Glucose, Bld 118 (H) 65 - 99 mg/dL   BUN 12 6 - 20 mg/dL   Creatinine, Ser 0.98 0.61 - 1.24 mg/dL   Calcium 8.8 (L) 8.9 - 10.3 mg/dL   Total Protein 6.3 (L) 6.5 - 8.1 g/dL   Albumin 2.9 (L) 3.5 - 5.0 g/dL   AST 14 (L) 15 - 41 U/L   ALT 11 (L) 17 - 63 U/L   Alkaline Phosphatase 94 38 - 126 U/L   Total Bilirubin 0.5 0.3 - 1.2 mg/dL   GFR calc non Af  Amer >60 >60 mL/min   GFR calc Af Amer >60 >60 mL/min   Anion gap 7 5 - 15  Urinalysis, Complete w Microscopic  Result Value Ref Range   Color, Urine YELLOW YELLOW   APPearance CLEAR CLEAR   Specific Gravity, Urine 1.030 1.005 - 1.030   pH 5.0 5.0 - 8.0   Glucose, UA NEGATIVE NEGATIVE mg/dL   Hgb urine dipstick NEGATIVE NEGATIVE   Bilirubin Urine NEGATIVE NEGATIVE   Ketones, ur NEGATIVE NEGATIVE mg/dL   Protein, ur NEGATIVE NEGATIVE mg/dL   Nitrite NEGATIVE NEGATIVE   Leukocytes, UA NEGATIVE NEGATIVE   RBC / HPF NONE SEEN 0 - 5 RBC/hpf   WBC, UA 0-5 0 - 5 WBC/hpf   Bacteria, UA NONE SEEN NONE SEEN   Squamous Epithelial / LPF 0-5 (A) NONE SEEN  Protime-INR  Result Value Ref Range   Prothrombin Time 14.9 11.4 - 15.2 seconds   INR 1.17   APTT  Result Value Ref Range   aPTT 37 (H) 24 - 36 seconds  HIV antibody (Routine Testing)  Result Value Ref Range   HIV Screen 4th Generation wRfx Non Reactive Non Reactive  CBC with Differential/Platelet  Result Value Ref Range   WBC 9.4 4.0 - 10.5 K/uL   RBC 3.16 (L) 4.22 - 5.81 MIL/uL   Hemoglobin 9.9 (L) 13.0 - 17.0 g/dL   HCT 30.9 (L) 39.0 - 52.0 %   MCV 97.8 78.0 - 100.0 fL   MCH 31.3 26.0 - 34.0 pg   MCHC 32.0 30.0 - 36.0 g/dL   RDW 14.1 11.5 - 15.5 %   Platelets 256 150 - 400 K/uL   Neutrophils Relative % 82 %   Neutro Abs 7.8 (H) 1.7 - 7.7 K/uL   Lymphocytes  Relative 11 %   Lymphs Abs 1.0 0.7 - 4.0 K/uL   Monocytes Relative 5 %   Monocytes Absolute 0.4 0.1 - 1.0 K/uL   Eosinophils Relative 2 %   Eosinophils Absolute 0.2 0.0 - 0.7 K/uL   Basophils Relative 0 %   Basophils Absolute 0.0 0.0 - 0.1 K/uL  Comprehensive metabolic panel  Result Value Ref Range   Sodium 136 135 - 145 mmol/L   Potassium 4.3 3.5 - 5.1 mmol/L   Chloride 103 101 - 111 mmol/L   CO2 27 22 - 32 mmol/L   Glucose, Bld 101 (H) 65 - 99 mg/dL   BUN 17 6 - 20 mg/dL   Creatinine, Ser 1.00 0.61 - 1.24 mg/dL   Calcium 8.3 (L) 8.9 - 10.3 mg/dL   Total  Protein 5.5 (L) 6.5 - 8.1 g/dL   Albumin 2.3 (L) 3.5 - 5.0 g/dL   AST 11 (L) 15 - 41 U/L   ALT 9 (L) 17 - 63 U/L   Alkaline Phosphatase 83 38 - 126 U/L   Total Bilirubin 0.2 (L) 0.3 - 1.2 mg/dL   GFR calc non Af Amer >60 >60 mL/min   GFR calc Af Amer >60 >60 mL/min   Anion gap 6 5 - 15  Comprehensive metabolic panel  Result Value Ref Range   Sodium 140 135 - 145 mmol/L   Potassium 4.9 3.5 - 5.1 mmol/L   Chloride 107 101 - 111 mmol/L   CO2 27 22 - 32 mmol/L   Glucose, Bld 103 (H) 65 - 99 mg/dL   BUN 10 6 - 20 mg/dL   Creatinine, Ser 0.82 0.61 - 1.24 mg/dL   Calcium 8.5 (L) 8.9 - 10.3 mg/dL   Total Protein 5.8 (L) 6.5 - 8.1 g/dL   Albumin 2.4 (L) 3.5 - 5.0 g/dL   AST 11 (L) 15 - 41 U/L   ALT 9 (L) 17 - 63 U/L   Alkaline Phosphatase 77 38 - 126 U/L   Total Bilirubin 0.2 (L) 0.3 - 1.2 mg/dL   GFR calc non Af Amer >60 >60 mL/min   GFR calc Af Amer >60 >60 mL/min   Anion gap 6 5 - 15  Comprehensive metabolic panel  Result Value Ref Range   Sodium 139 135 - 145 mmol/L   Potassium 4.0 3.5 - 5.1 mmol/L   Chloride 106 101 - 111 mmol/L   CO2 26 22 - 32 mmol/L   Glucose, Bld 107 (H) 65 - 99 mg/dL   BUN 6 6 - 20 mg/dL   Creatinine, Ser 0.66 0.61 - 1.24 mg/dL   Calcium 8.3 (L) 8.9 - 10.3 mg/dL   Total Protein 5.5 (L) 6.5 - 8.1 g/dL   Albumin 2.2 (L) 3.5 - 5.0 g/dL   AST 13 (L) 15 - 41 U/L   ALT 9 (L) 17 - 63 U/L   Alkaline Phosphatase 73 38 - 126 U/L   Total Bilirubin 0.1 (L) 0.3 - 1.2 mg/dL   GFR calc non Af Amer >60 >60 mL/min   GFR calc Af Amer >60 >60 mL/min   Anion gap 7 5 - 15  CBC with Differential/Platelet  Result Value Ref Range   WBC 6.0 4.0 - 10.5 K/uL   RBC 3.11 (L) 4.22 - 5.81 MIL/uL   Hemoglobin 9.8 (L) 13.0 - 17.0 g/dL   HCT 30.4 (L) 39.0 - 52.0 %   MCV 97.7 78.0 - 100.0 fL   MCH 31.5 26.0 - 34.0 pg  MCHC 32.2 30.0 - 36.0 g/dL   RDW 14.4 11.5 - 15.5 %   Platelets 385 150 - 400 K/uL   Neutrophils Relative % 67 %   Neutro Abs 4.0 1.7 - 7.7 K/uL    Lymphocytes Relative 23 %   Lymphs Abs 1.4 0.7 - 4.0 K/uL   Monocytes Relative 6 %   Monocytes Absolute 0.4 0.1 - 1.0 K/uL   Eosinophils Relative 4 %   Eosinophils Absolute 0.2 0.0 - 0.7 K/uL   Basophils Relative 0 %   Basophils Absolute 0.0 0.0 - 0.1 K/uL  C-reactive protein  Result Value Ref Range   CRP 6.1 (H) <1.0 mg/dL    Discharge Medications:   Allergies as of 06/15/2017      Reactions   No Known Allergies       Medication List    STOP taking these medications   cephALEXin 500 MG capsule Commonly known as:  KEFLEX   oxyCODONE-acetaminophen 10-325 MG tablet Commonly known as:  PERCOCET   oxyCODONE-acetaminophen 7.5-325 MG tablet Commonly known as:  PERCOCET     TAKE these medications   amLODipine 5 MG tablet Commonly known as:  NORVASC Take 5 mg by mouth daily.   celecoxib 200 MG capsule Commonly known as:  CELEBREX Take 1 capsule (200 mg total) by mouth every 12 (twelve) hours.   cimetidine 200 MG tablet Commonly known as:  TAGAMET Take 200 mg by mouth daily.   citalopram 20 MG tablet Commonly known as:  CELEXA Take 20 mg by mouth daily.   docusate sodium 100 MG capsule Commonly known as:  COLACE Take 1 capsule (100 mg total) by mouth 2 (two) times daily.   ferrous sulfate 325 (65 FE) MG tablet Take 1 tablet (325 mg total) by mouth 2 (two) times daily with a meal.   Lacosamide 150 MG Tabs Take 1 tablet (150 mg total) by mouth 2 (two) times daily.   levETIRAcetam 750 MG tablet Commonly known as:  KEPPRA Take 750 mg by mouth 2 (two) times daily.   lisinopril 40 MG tablet Commonly known as:  PRINIVIL,ZESTRIL Take 40 mg by mouth daily.   methocarbamol 500 MG tablet Commonly known as:  ROBAXIN Take 1 tablet (500 mg total) by mouth every 6 (six) hours as needed for muscle spasms. What changed:  Another medication with the same name was added. Make sure you understand how and when to take each.   methocarbamol 500 MG tablet Commonly known as:   ROBAXIN Take 1 tablet (500 mg total) by mouth every 8 (eight) hours as needed for muscle spasms. What changed:  You were already taking a medication with the same name, and this prescription was added. Make sure you understand how and when to take each.   mupirocin cream 2 % Commonly known as:  BACTROBAN Apply 1 application topically 2 (two) times daily.   omeprazole 20 MG capsule Commonly known as:  PRILOSEC Take 20 mg by mouth daily as needed (heartburn).     ASK your doctor about these medications   ceFAZolin 1-4 GM/50ML-% Soln Commonly known as:  ANCEF Inject 50 mLs (1 g total) into the vein every 8 (eight) hours. Ask about: Should I take this medication?   ceFAZolin IVPB Commonly known as:  ANCEF Inject 1 g into the vein every 8 (eight) hours. Indication:  mssa wound infection Last Day of Therapy:  07/08/17 Labs - Once weekly:  CBC/D and BMP, Labs - Every other week:  ESR and  CRP Ask about: Should I take this medication?            Home Infusion Instuctions        Start     Ordered   06/15/17 0000  Home infusion instructions [facility] May follow [place] Dosing Protocol; May administer Cathflo as needed to maintain patency of vascular access device.; Flushing of vascular access device: per [facility] Protocol: 0.9% NaCl pre/post medica...    Question Answer Comment  Instructions May follow [place] Dosing Protocol   Instructions May administer Cathflo as needed to maintain patency of vascular access device.   Instructions Flushing of vascular access device: per [facility] Protocol: 0.9% NaCl pre/post medication administration and prn patency; Heparin 100 u/ml, 79m for implanted ports and Heparin 10u/ml, 580mfor all other central venous catheters.   Instructions May follow [facility] Anaphylaxis Protocol for First Dose Administration in the home: 0.9% NaCl at 25-50 ml/hr to maintain IV access for protocol meds. Epinephrine 0.3 ml IV/IM PRN and Benadryl 25-50 IV/IM PRN s/s of  anaphylaxis.   Instructions [facility] Infusion Coordinator (RN) to assist per patient IV care needs in the home PRN.      06/15/17 0856      Diagnostic Studies: Xr Lumbar Spine 2-3 Views  Result Date: 06/22/2017 AP and lateral radiographs of the lumbar spine with cage and bone graft within the intervertebral disc space L5-S1, hardware without abnormality. No change since last radiographs 5/25.    Disposition: 01-Home or Self Care  Discharge Instructions    Call MD / Call 911    Complete by:  As directed    If you experience chest pain or shortness of breath, CALL 911 and be transported to the hospital emergency room.  If you develope a fever above 101 F, pus (white drainage) or increased drainage or redness at the wound, or calf pain, call your surgeon's office.   Constipation Prevention    Complete by:  As directed    Drink plenty of fluids.  Prune juice may be helpful.  You may use a stool softener, such as Colace (over the counter) 100 mg twice a day.  Use MiraLax (over the counter) for constipation as needed.   Diet - low sodium heart healthy    Complete by:  As directed    Discharge instructions    Complete by:  As directed    Call if there is increasing drainage, fever greater than 101.5, severe head aches, and worsening nausea or light sensitivity. If shortness of breath, bloody cough or chest tightness or pain go to an emergency room. No lifting greater than 10 lbs. Avoid bending, stooping and twisting. Use brace when sitting and out of bed even to go to bathroom. Walk in house for first 2 weeks then may start to get out slowly increasing distances up to one mile by 4-6 weeks post op. After 5 days may shower on the day of VAC dressing change and change dressing following bathing with shower.When bathing remove the brace shower and replace brace before getting out of the shower. If drainage, keep dry dressing and do not bathe the incision, use an moisture impervious  dressing. Please call and return for scheduled follow up appointment 2 weeks from the time of surgery.   Driving restrictions    Complete by:  As directed    No driving for 4 weeks   Home infusion instructions [facility] follow [facility] Protocol; May administer  Cathflo as needed to maintain patency of vascular access device.; Flushing of vascular access device: per [facility] Protocol: 0.9% NaCl pre/post medica...    Complete by:  As directed    Instructions:  May follow [place] Dosing Protocol   Instructions:  May administer Cathflo as needed to maintain patency of vascular access device.   Instructions:  Flushing of vascular access device: per [facility] Protocol: 0.9% NaCl pre/post medication administration and prn patency; Heparin 100 u/ml, 1m for implanted ports and Heparin 10u/ml, 567mfor all other central venous catheters.   Instructions:  May follow [facility] Anaphylaxis Protocol for First Dose Administration in the home: 0.9% NaCl at 25-50 ml/hr to maintain IV access for protocol meds. Epinephrine 0.3 ml IV/IM PRN and Benadryl 25-50 IV/IM PRN s/s of anaphylaxis.   Instructions:  [facility] Coord[facility] (RN) to assist per patient IV care needs in the home PRN.   Increase activity slowly as tolerated    Complete by:  As directed    Lifting restrictions    Complete by:  As directed    No lifting for 8 weeks      Follow-up Information    NiJessy OtoMD Follow up in 1 week(s).   Specialty:  Orthopedic Surgery Why:  For wound re-check Contact information: [id number] [phone]          Signed: JaJessy Oto/17/2018, 4:36 PM

## 2017-07-13 ENCOUNTER — Ambulatory Visit (INDEPENDENT_AMBULATORY_CARE_PROVIDER_SITE_OTHER): Payer: Medicare Other | Admitting: Specialist

## 2017-07-13 ENCOUNTER — Encounter (INDEPENDENT_AMBULATORY_CARE_PROVIDER_SITE_OTHER): Payer: Self-pay | Admitting: Specialist

## 2017-07-13 DIAGNOSIS — IMO0001 Reserved for inherently not codable concepts without codable children: Secondary | ICD-10-CM

## 2017-07-13 DIAGNOSIS — T814XXD Infection following a procedure, subsequent encounter: Secondary | ICD-10-CM

## 2017-07-13 DIAGNOSIS — Z981 Arthrodesis status: Secondary | ICD-10-CM

## 2017-07-13 MED ORDER — OXYCODONE HCL 5 MG PO TABS: 5.0000 mg | ORAL_TABLET | Freq: Four times a day (QID) | ORAL | 0 refills | Status: DC | PRN

## 2017-07-13 NOTE — Progress Notes (Addendum)
Post-Op Visit Note   Patient: [name]           Date of Birth: 02-May-1956           MRN: [id number] Visit Date: 07/13/2017 PCP: Simone CuriaLee, Keung, MD   Assessment & Plan:2 months post L5-S1 fusion, 3 weeks post Incision and drainage and debridement of surgical site Infection. Has discontinued antibiotics. Still taking 6 oxyIR per day.     Chief Complaint:  Chief Complaint  Patient presents with  . Lower Back - Routine Post Op, Wound Check  Clinically he is in constant motion, moving his arms and head side to side with mouth and lip movements similar to  Tardive dyskinesia. Lumbar incision is granulating 100% 3.5 inches by 1.0 inch by 0.75 inches LE motor and neurotension signs is normal.   Visit Diagnoses:  1. Status post lumbar spinal fusion   2. Deep incisional surgical site infection, subsequent encounter     Plan: Recommend cutting back on narcotics and stopping over the next 6 weeks. I will stop the prescriptions for narcotics in 6 weeks.  Walking for exercise up to one mile per day. See Dr. Luciana Axeomer to review treatment of the post operative infection and discontinuing of the PICCU line.  Follow-Up Instructions: No Follow-up on file.   Orders:  No orders of the defined types were placed in this encounter.  Meds ordered this encounter  Medications  . oxyCODONE (OXY IR/ROXICODONE) 5 MG immediate release tablet    Sig: Take 1 tablet (5 mg total) by mouth every 6 (six) hours as needed for breakthrough pain.    Dispense:  60 tablet    Refill:  0    Imaging: No results found.  PMFS History: Patient Active Problem List   Diagnosis Date Noted  . Deep incisional surgical site infection 06/10/2017    Priority: High  . Spinal stenosis of lumbar region 05/09/2017    Priority: High    Class: Chronic  . Status post lumbar spinal fusion 06/10/2017    Priority: Medium    Class: Acute  . Localization-related idiopathic epilepsy and epileptic syndromes with seizures of  localized onset, not intractable, with status epilepticus (HCC) 03/31/2017  . Medication monitoring encounter 03/31/2017  . Drug induced subacute dyskinesia 03/01/2017  . Seizure disorder (HCC) 03/01/2017  . Spinal stenosis of lumbar region with neurogenic claudication 03/01/2017  . Sciatica of left side 03/01/2017  . 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 Acute left-sided low back pain with left-sided sciatica 12/02/2016  . Avascular necrosis of hip (HCC) 02/05/2015   Past Medical History:  Diagnosis Date  . Depression   . GERD (gastroesophageal reflux disease)    OTC meds  . Headache   . History of kidney stones   . Hypertension   . Seizures (HCC)    several months since the last surgery    No family history on file.  Past Surgical History:  Procedure Laterality Date  . JOINT REPLACEMENT Bilateral    hip right and left  . LUMBAR LAMINECTOMY N/A 06/11/2017   Procedure: Incision, Drainage and Debridement of lumbar surgical incision site, possible vac placement;  Surgeon: Kerrin ChampagneNitka, James E, MD;  Location: [facility] OR;  Service: Orthopedics;  Laterality: N/A;  . ROTATOR CUFF REPAIR Left   . TOTAL HIP ARTHROPLASTY Left 02/05/2015   Procedure: TOTAL HIP ARTHROPLASTY;  Surgeon: Nadara MustardMarcus Duda V, MD;  Location: [facility] OR;  Service: Orthopedics;  Laterality: Left;  . TRANSFORAMINAL LUMBAR INTERBODY  FUSION (TLIF) WITH PEDICLE SCREW FIXATION 1 LEVEL  05/09/2017   TLIF fusion L5-S1 - Dr. Otelia Sergeant   Social History   Occupational History  . Not on file.   Social History Main Topics  . Smoking status: Never Smoker  . Smokeless tobacco: Never Used  . Alcohol use 2.4 oz/week    4 Cans of beer per week  . Drug use: No  . Sexual activity: Not on file

## 2017-07-13 NOTE — Telephone Encounter (Signed)
I have no lab requirements for [name], he is seeing Dr. Luciana Axeomer of ID on Friday. The PICCU line will probably be discontinued at that time. Hold on lab if unable to draw and unable to find another vein to obtan specimen. jen

## 2017-07-13 NOTE — Patient Instructions (Signed)
Plan: Recommend cutting back on narcotics and stopping over the next 6 weeks. I will stop the prescriptions for narcotics in 6 weeks.  Walking for exercise up to one mile per day. See Dr. Luciana Axeomer to review treatment of the post operative infection and discontinuing of the PICCU line.

## 2017-07-14 ENCOUNTER — Encounter (HOSPITAL_COMMUNITY): Payer: Self-pay | Admitting: Nurse Practitioner

## 2017-07-14 ENCOUNTER — Emergency Department (HOSPITAL_COMMUNITY)
Admission: EM | Admit: 2017-07-14 | Discharge: 2017-07-14 | Disposition: A | Discharge status: Home / Self Care | Payer: Medicare Other | Attending: Emergency Medicine | Admitting: Emergency Medicine

## 2017-07-14 ENCOUNTER — Emergency Department (HOSPITAL_COMMUNITY): Payer: Medicare Other

## 2017-07-14 DIAGNOSIS — Z79899 Other long term (current) drug therapy: Secondary | ICD-10-CM | POA: Insufficient documentation

## 2017-07-14 DIAGNOSIS — T814XXA Infection following a procedure, initial encounter: Secondary | ICD-10-CM | POA: Diagnosis not present

## 2017-07-14 DIAGNOSIS — M545 Low back pain, unspecified: Secondary | ICD-10-CM

## 2017-07-14 DIAGNOSIS — T82599A Other mechanical complication of unspecified cardiac and vascular devices and implants, initial encounter: Secondary | ICD-10-CM | POA: Diagnosis not present

## 2017-07-14 DIAGNOSIS — F329 Major depressive disorder, single episode, unspecified: Secondary | ICD-10-CM | POA: Diagnosis not present

## 2017-07-14 DIAGNOSIS — M549 Dorsalgia, unspecified: Secondary | ICD-10-CM | POA: Diagnosis not present

## 2017-07-14 DIAGNOSIS — K219 Gastro-esophageal reflux disease without esophagitis: Secondary | ICD-10-CM | POA: Diagnosis not present

## 2017-07-14 DIAGNOSIS — Z96643 Presence of artificial hip joint, bilateral: Secondary | ICD-10-CM | POA: Diagnosis not present

## 2017-07-14 DIAGNOSIS — I10 Essential (primary) hypertension: Secondary | ICD-10-CM | POA: Diagnosis not present

## 2017-07-14 DIAGNOSIS — Z452 Encounter for adjustment and management of vascular access device: Secondary | ICD-10-CM | POA: Diagnosis not present

## 2017-07-14 DIAGNOSIS — B999 Unspecified infectious disease: Secondary | ICD-10-CM | POA: Diagnosis not present

## 2017-07-14 DIAGNOSIS — G8929 Other chronic pain: Secondary | ICD-10-CM

## 2017-07-14 DIAGNOSIS — R251 Tremor, unspecified: Secondary | ICD-10-CM | POA: Diagnosis not present

## 2017-07-14 DIAGNOSIS — Y828 Other medical devices associated with adverse incidents: Secondary | ICD-10-CM | POA: Insufficient documentation

## 2017-07-14 DIAGNOSIS — Z5181 Encounter for therapeutic drug level monitoring: Secondary | ICD-10-CM | POA: Diagnosis not present

## 2017-07-14 MED ORDER — OXYCODONE-ACETAMINOPHEN 5-325 MG PO TABS
2.0000 | ORAL_TABLET | Freq: Once | ORAL | Status: AC
  Administered 2017-07-14: 2 via ORAL
  Filled 2017-07-14: qty 2

## 2017-07-14 NOTE — Discharge Instructions (Signed)
It was our pleasure to provide your ER care today - we hope that you feel better.  Continue to keep wound very clean.  Change dressing as instructed by your doctor/surgeon.  Take motrin or aleve as need for pain.  Contact your doctor should you need stronger pain medication.   Follow up with your back specialist in the coming week.  Return to ER if worse, new symptoms, fevers, spreading redness around wound, pus from wound, or other medical emergency.

## 2017-07-14 NOTE — ED Notes (Signed)
Cancelled PTAR 

## 2017-07-14 NOTE — ED Notes (Signed)
Pt to xray

## 2017-07-14 NOTE — ED Notes (Signed)
Pt left without signing. This RN, Selena BattenKim and Bre all went and looked for pt. Pt not able to be found.

## 2017-07-14 NOTE — ED Triage Notes (Signed)
Per EMS pt from home for back pain and accidental removal of PICC line. Patient had back surgery 4-5 weeks ago and had PICC placed for abx post-op. Patient completed abx therapy and was taking his shirt off yesterday when it got caught on PICC line and accidentally removed it. PICC at bedside- end of PICC line missing- line is cut off at 45 marker. Pt has been out of oxycodone since Tuesday and went to fill it yesterday but insurance wont pay for it until tomorrow. Home health nurse states normally patient is ambulatory able to do PT but today he was in bed unable to move due to pain. Patient able to walk to stretcher.

## 2017-07-14 NOTE — ED Provider Notes (Signed)
[facility]-EMERGENCY DEPT Provider Note   CSN: [id number] Arrival date & time: 07/14/17  1304     History   Chief Complaint Chief Complaint  Patient presents with  . Back Pain    HPI MASUD HOLUB is a 61 y.o. male.  Patient with lumbar disc surgery several weeks ago, with subsequent surgery/opening wound for infection, presents from [facility] c/o low back pain. Pain constant, dull, non radiating. No leg pain. No numbness/weakness. No increased drainage from wound. No fever or chills. This week was unable to get percocet refilled. States has not had anything for pain today. Denies trauma, fall or back strain.  No acute or abrupt change in his pain.    The history is provided by the patient.  Back Pain   Pertinent negatives include no chest pain, no fever, no headaches and no abdominal pain.    Past Medical History:  Diagnosis Date  . Depression   . GERD (gastroesophageal reflux disease)    OTC meds  . Headache   . History of kidney stones   . Hypertension   . Seizures (HCC)    several months since the last surgery    Patient Active Problem List   Diagnosis Date Noted  . Deep incisional surgical site infection 06/10/2017  . Status post lumbar spinal fusion 06/10/2017    Class: Acute  . Spinal stenosis of lumbar region 05/09/2017    Class: Chronic  . Localization-related idiopathic epilepsy and epileptic syndromes with seizures of localized onset, not intractable, with status epilepticus (HCC) 03/31/2017  . Medication monitoring encounter 03/31/2017  . Drug induced subacute dyskinesia 03/01/2017  . Seizure disorder (HCC) 03/01/2017  . Spinal stenosis of lumbar region with neurogenic claudication 03/01/2017  . Sciatica of left side 03/01/2017  . 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 Acute left-sided low back pain with left-sided sciatica 12/02/2016  . Avascular necrosis of hip (HCC) 02/05/2015    Past Surgical History:  Procedure Laterality Date  .  JOINT REPLACEMENT Bilateral    hip right and left  . LUMBAR LAMINECTOMY N/A 06/11/2017   Procedure: Incision, Drainage and Debridement of lumbar surgical incision site, possible vac placement;  Surgeon: [name], MD;  Location: [facility] OR;  Service: Orthopedics;  Laterality: N/A;  . ROTATOR CUFF REPAIR Left   . TOTAL HIP ARTHROPLASTY Left 02/05/2015   Procedure: TOTAL HIP ARTHROPLASTY;  Surgeon: Nadara Mustard, MD;  Location: [facility] OR;  Service: Orthopedics;  Laterality: Left;  . TRANSFORAMINAL LUMBAR INTERBODY FUSION (TLIF) WITH PEDICLE SCREW FIXATION 1 LEVEL  05/09/2017   TLIF fusion L5-S1 - Dr. Otelia Sergeant       Home Medications    Prior to Admission medications   Medication Sig Start Date End Date Taking? Authorizing Provider  amLODipine (NORVASC) 5 MG tablet Take 5 mg by mouth daily. 10/07/16   [provider]  celecoxib (CELEBREX) 200 MG capsule Take 1 capsule (200 mg total) by mouth every 12 (twelve) hours. 06/15/17   [name], MD  cimetidine (TAGAMET) 200 MG tablet Take 200 mg by mouth daily.    [provider]  citalopram (CELEXA) 20 MG tablet Take 20 mg by mouth daily.    [provider]  docusate sodium (COLACE) 100 MG capsule Take 1 capsule (100 mg total) by mouth 2 (two) times daily. 06/15/17   [name], MD  ferrous sulfate 325 (65 FE) MG tablet Take 1 tablet (325 mg total) by mouth 2 (two) times daily  with a meal. 06/15/17   Kerrin ChampagneNitka, James E, MD  Lacosamide 150 MG TABS Take 1 tablet (150 mg total) by mouth 2 (two) times daily. 03/31/17   Dohmeier, Porfirio Mylararmen, MD  levETIRAcetam (KEPPRA) 750 MG tablet Take 750 mg by mouth 2 (two) times daily.    [provider]  lisinopril (PRINIVIL,ZESTRIL) 40 MG tablet Take 40 mg by mouth daily.    [provider]  methocarbamol (ROBAXIN) 500 MG tablet Take 1 tablet (500 mg total) by mouth every 6 (six) hours as needed for muscle spasms. 05/11/17   Naida Sleightwens, James M, PA-C  methocarbamol (ROBAXIN) 500 MG tablet  Take 1 tablet (500 mg total) by mouth every 8 (eight) hours as needed for muscle spasms. 06/15/17   Kerrin ChampagneNitka, James E, MD  mupirocin cream (BACTROBAN) 2 % Apply 1 application topically 2 (two) times daily. 05/20/17   Kerrin ChampagneNitka, James E, MD  omeprazole (PRILOSEC) 20 MG capsule Take 20 mg by mouth daily as needed (heartburn).    [provider]  oxyCODONE (OXY IR/ROXICODONE) 5 MG immediate release tablet Take 1 tablet (5 mg total) by mouth every 6 (six) hours as needed for breakthrough pain. 07/13/17   Kerrin ChampagneNitka, James E, MD    Family History No family history on file.  Social History Social History  Substance Use Topics  . Smoking status: Never Smoker  . Smokeless tobacco: Never Used  . Alcohol use 2.4 oz/week    4 Cans of beer per week     Allergies   No known allergies   Review of Systems Review of Systems  Constitutional: Negative for fever.  HENT: Negative for sore throat.   Eyes: Negative for redness.  Respiratory: Negative for shortness of breath.   Cardiovascular: Negative for chest pain.  Gastrointestinal: Negative for abdominal pain.  Genitourinary: Negative for flank pain.  Musculoskeletal: Positive for back pain. Negative for neck pain.  Skin: Negative for rash.  Neurological: Negative for headaches.  Hematological: Does not bruise/bleed easily.  Psychiatric/Behavioral: Negative for confusion.     Physical Exam Updated Vital Signs BP 115/79 (BP Location: Right Arm)   Pulse 96   Temp 98.9 F (37.2 C) (Oral)   Resp 18   Ht 1.727 m (5\' 8" )   Wt 68.9 kg (152 lb)   SpO2 98%   BMI 23.11 kg/m   Physical Exam  Constitutional: He appears well-developed and well-nourished. No distress.  HENT:  Mouth/Throat: Oropharynx is clear and moist.  Eyes: Conjunctivae are normal.  Neck: Neck supple. No tracheal deviation present.  Cardiovascular: Normal rate, regular rhythm, normal heart sounds and intact distal pulses.   Pulmonary/Chest: Effort normal and breath sounds  normal. No accessory muscle usage. No respiratory distress.  Abdominal: Soft. He exhibits no distension. There is no tenderness.  Genitourinary:  Genitourinary Comments: No cva tenderness  Musculoskeletal: He exhibits no edema.  Prior picc line site without sign of infection. No fb. Distal pulses palp.  Open lumbar surgical wound, granulating. Tissue appears health. No devitalized tissue. No malodor. No purulent drainage. No cellulitis.   Neurological: He is alert.  Hyperkinetic movements.  Motor intact bil, stre 5/5. sens grossly intact.   Skin: Skin is warm and dry. No rash noted. He is not diaphoretic.  Psychiatric: He has a normal mood and affect.  Nursing note and vitals reviewed.    ED Treatments / Results  Labs (all labs ordered are listed, but only abnormal results are displayed) Labs Reviewed - No data to display  EKG  EKG Interpretation None       Radiology Dg Chest 2 View  Result Date: 07/14/2017 CLINICAL DATA:  61 y/o male with accidental PICC line removal yesterday. EXAM: CHEST  2 VIEW COMPARISON:  Chest radiographs 03/01/2016. FINDINGS: Semi upright AP and lateral views of the chest. Normal cardiac size and mediastinal contours. Stable somewhat low lung volumes. Lungs remain clear. No pneumothorax or pleural effusion. Visualized tracheal air column is within normal limits. No acute osseous abnormality identified. Negative visible bowel gas pattern. No radiopaque foreign body identified. IMPRESSION: Negative.  No acute cardiopulmonary abnormality. Electronically Signed   By: Odessa Fleming M.D.   On: 07/14/2017 14:12    Procedures Procedures (including critical care time)  Medications Ordered in ED Medications  oxyCODONE-acetaminophen (PERCOCET/ROXICET) 5-325 MG per tablet 2 tablet (2 tablets Oral Given 07/14/17 1332)     Initial Impression / Assessment and Plan / ED Course  I have reviewed the triage vital signs and the nursing notes.  Pertinent labs & imaging  results that were available during my care of the patient were reviewed by me and considered in my medical decision making (see chart for details).  Percocet po for pain.  Reviewed nursing notes and prior charts for additional history.   Recheck pt comfortable. Pain improved. No sign of infection.  rec close f/u with his spine specialist.   Return precautions provided.   Final Clinical Impressions(s) / ED Diagnoses   Final diagnoses:  None    New Prescriptions New Prescriptions   No medications on file     Cathren Laine, MD 07/14/17 1443

## 2017-07-14 NOTE — ED Notes (Addendum)
Pt updated on need to wait for PTAR. Offered fluids and food. Pt requests we "just call a cab".

## 2017-07-15 NOTE — Telephone Encounter (Signed)
I called and notified Noreene LarssonJill, she states that she called EMS and sent him to the hospital yesterday, states he was showing seizure like activity but she doesn't know if he was admitted or not

## 2017-07-16 DIAGNOSIS — I10 Essential (primary) hypertension: Secondary | ICD-10-CM | POA: Diagnosis not present

## 2017-07-16 DIAGNOSIS — Z5181 Encounter for therapeutic drug level monitoring: Secondary | ICD-10-CM | POA: Diagnosis not present

## 2017-07-16 DIAGNOSIS — T814XXA Infection following a procedure, initial encounter: Secondary | ICD-10-CM | POA: Diagnosis not present

## 2017-07-16 DIAGNOSIS — Z452 Encounter for adjustment and management of vascular access device: Secondary | ICD-10-CM | POA: Diagnosis not present

## 2017-07-16 DIAGNOSIS — K219 Gastro-esophageal reflux disease without esophagitis: Secondary | ICD-10-CM | POA: Diagnosis not present

## 2017-07-16 DIAGNOSIS — F329 Major depressive disorder, single episode, unspecified: Secondary | ICD-10-CM | POA: Diagnosis not present

## 2017-07-20 DIAGNOSIS — F329 Major depressive disorder, single episode, unspecified: Secondary | ICD-10-CM | POA: Diagnosis not present

## 2017-07-20 DIAGNOSIS — Z5181 Encounter for therapeutic drug level monitoring: Secondary | ICD-10-CM | POA: Diagnosis not present

## 2017-07-20 DIAGNOSIS — I10 Essential (primary) hypertension: Secondary | ICD-10-CM | POA: Diagnosis not present

## 2017-07-20 DIAGNOSIS — Z452 Encounter for adjustment and management of vascular access device: Secondary | ICD-10-CM | POA: Diagnosis not present

## 2017-07-20 DIAGNOSIS — K219 Gastro-esophageal reflux disease without esophagitis: Secondary | ICD-10-CM | POA: Diagnosis not present

## 2017-07-20 DIAGNOSIS — T814XXA Infection following a procedure, initial encounter: Secondary | ICD-10-CM | POA: Diagnosis not present

## 2017-07-21 ENCOUNTER — Telehealth (INDEPENDENT_AMBULATORY_CARE_PROVIDER_SITE_OTHER): Payer: Self-pay | Admitting: Radiology

## 2017-07-21 NOTE — Telephone Encounter (Signed)
Montel ClockKierra called late yesterday and LM on triage VM that patient has accidentally removed his PICC line, but that he is done with the IV Abx. FYI

## 2017-07-21 NOTE — Telephone Encounter (Signed)
Kierra called late yesterday and LM on triage VM that patient has accidentally removed his PICC line, but that he is done with the IV Abx. FYI 

## 2017-07-22 DIAGNOSIS — Z452 Encounter for adjustment and management of vascular access device: Secondary | ICD-10-CM | POA: Diagnosis not present

## 2017-07-22 DIAGNOSIS — Z5181 Encounter for therapeutic drug level monitoring: Secondary | ICD-10-CM | POA: Diagnosis not present

## 2017-07-22 DIAGNOSIS — T814XXA Infection following a procedure, initial encounter: Secondary | ICD-10-CM | POA: Diagnosis not present

## 2017-07-22 DIAGNOSIS — F329 Major depressive disorder, single episode, unspecified: Secondary | ICD-10-CM | POA: Diagnosis not present

## 2017-07-22 DIAGNOSIS — I10 Essential (primary) hypertension: Secondary | ICD-10-CM | POA: Diagnosis not present

## 2017-07-22 DIAGNOSIS — K219 Gastro-esophageal reflux disease without esophagitis: Secondary | ICD-10-CM | POA: Diagnosis not present

## 2017-07-25 ENCOUNTER — Telehealth (INDEPENDENT_AMBULATORY_CARE_PROVIDER_SITE_OTHER): Payer: Self-pay | Admitting: Specialist

## 2017-07-25 ENCOUNTER — Other Ambulatory Visit (INDEPENDENT_AMBULATORY_CARE_PROVIDER_SITE_OTHER): Payer: Self-pay | Admitting: Specialist

## 2017-07-25 DIAGNOSIS — K219 Gastro-esophageal reflux disease without esophagitis: Secondary | ICD-10-CM | POA: Diagnosis not present

## 2017-07-25 DIAGNOSIS — Z452 Encounter for adjustment and management of vascular access device: Secondary | ICD-10-CM | POA: Diagnosis not present

## 2017-07-25 DIAGNOSIS — F329 Major depressive disorder, single episode, unspecified: Secondary | ICD-10-CM | POA: Diagnosis not present

## 2017-07-25 DIAGNOSIS — I10 Essential (primary) hypertension: Secondary | ICD-10-CM | POA: Diagnosis not present

## 2017-07-25 DIAGNOSIS — T814XXA Infection following a procedure, initial encounter: Secondary | ICD-10-CM | POA: Diagnosis not present

## 2017-07-25 DIAGNOSIS — Z5181 Encounter for therapeutic drug level monitoring: Secondary | ICD-10-CM | POA: Diagnosis not present

## 2017-07-25 MED ORDER — HYDROCODONE-ACETAMINOPHEN 5-325 MG PO TABS: 1.0000 | ORAL_TABLET | ORAL | 0 refills | Status: AC | PRN

## 2017-07-25 NOTE — Telephone Encounter (Signed)
Okay to stop the PICCU line. [name]

## 2017-07-25 NOTE — Telephone Encounter (Signed)
No 2 1/2 months post fusion, and 6 weeks following the incision and drainage. Oxycodone is discontinues. Hydrocodone one every 4-6 hours per pain. #50. No further oxycodone, should not require any  Narcotics at this time, The prescription written to help him to wean off strong narcotics. jen

## 2017-07-25 NOTE — Telephone Encounter (Signed)
Patient called asking for a refill on oxycodone. CB # [phone]

## 2017-07-25 NOTE — Telephone Encounter (Signed)
Please advise 

## 2017-07-26 NOTE — Telephone Encounter (Signed)
I called and lmom that it was ok to d/c PICC Line

## 2017-07-26 NOTE — Telephone Encounter (Signed)
I also advised on machine that its time to wean down from the Oxycodone.

## 2017-07-26 NOTE — Telephone Encounter (Signed)
I called and lmom that his rx is ready for pik up at the front desk.

## 2017-07-27 DIAGNOSIS — Z452 Encounter for adjustment and management of vascular access device: Secondary | ICD-10-CM | POA: Diagnosis not present

## 2017-07-27 DIAGNOSIS — Z5181 Encounter for therapeutic drug level monitoring: Secondary | ICD-10-CM | POA: Diagnosis not present

## 2017-07-27 DIAGNOSIS — T814XXA Infection following a procedure, initial encounter: Secondary | ICD-10-CM | POA: Diagnosis not present

## 2017-07-27 DIAGNOSIS — I10 Essential (primary) hypertension: Secondary | ICD-10-CM | POA: Diagnosis not present

## 2017-07-27 DIAGNOSIS — F329 Major depressive disorder, single episode, unspecified: Secondary | ICD-10-CM | POA: Diagnosis not present

## 2017-07-27 DIAGNOSIS — K219 Gastro-esophageal reflux disease without esophagitis: Secondary | ICD-10-CM | POA: Diagnosis not present

## 2017-07-29 DIAGNOSIS — I10 Essential (primary) hypertension: Secondary | ICD-10-CM | POA: Diagnosis not present

## 2017-07-29 DIAGNOSIS — Z5181 Encounter for therapeutic drug level monitoring: Secondary | ICD-10-CM | POA: Diagnosis not present

## 2017-07-29 DIAGNOSIS — Z452 Encounter for adjustment and management of vascular access device: Secondary | ICD-10-CM | POA: Diagnosis not present

## 2017-07-29 DIAGNOSIS — T814XXA Infection following a procedure, initial encounter: Secondary | ICD-10-CM | POA: Diagnosis not present

## 2017-07-29 DIAGNOSIS — F329 Major depressive disorder, single episode, unspecified: Secondary | ICD-10-CM | POA: Diagnosis not present

## 2017-07-29 DIAGNOSIS — K219 Gastro-esophageal reflux disease without esophagitis: Secondary | ICD-10-CM | POA: Diagnosis not present

## 2017-08-27 DEATH — deceased

## 2017-09-05 ENCOUNTER — Ambulatory Visit (INDEPENDENT_AMBULATORY_CARE_PROVIDER_SITE_OTHER): Payer: Medicare Other | Admitting: Specialist

## 2017-10-03 ENCOUNTER — Ambulatory Visit: Payer: Medicare Other | Admitting: Adult Health

## 2017-10-04 ENCOUNTER — Encounter: Payer: Self-pay | Admitting: Adult Health

## 2019-02-07 IMAGING — RF DG LUMBAR SPINE COMPLETE 4+V
1 series · 4 of 4 positions shown · non-contrast
Comparison: MRI January 03, 2017.

CLINICAL DATA: Status post surgical posterior fusion of L5-S1.

EXAM:
DG C-ARM 61-120 MIN; LUMBAR SPINE - COMPLETE 4+ VIEW
FLUOROSCOPY TIME:  1 minutes 12 seconds.

[Series 1: run · 4 of 4 slices shown]
[im 1/4]
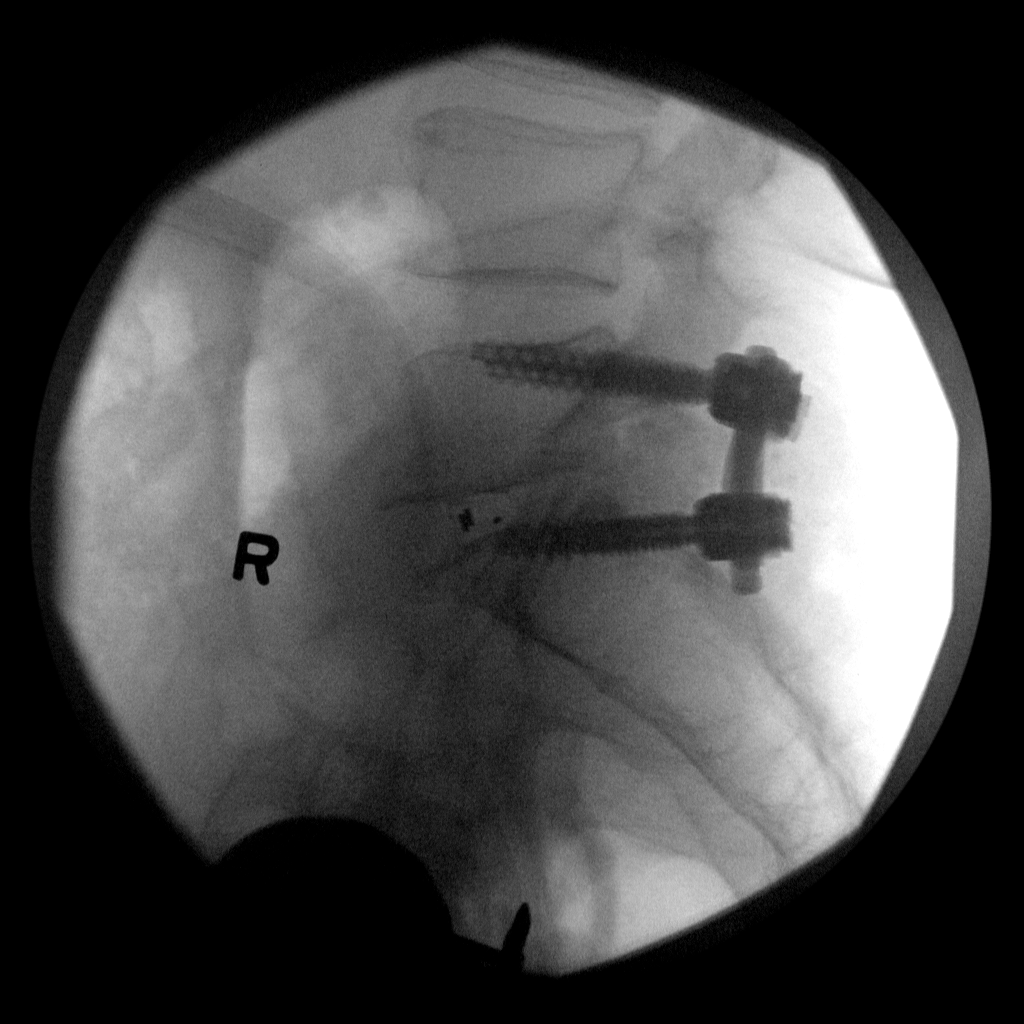
[im 2/4]
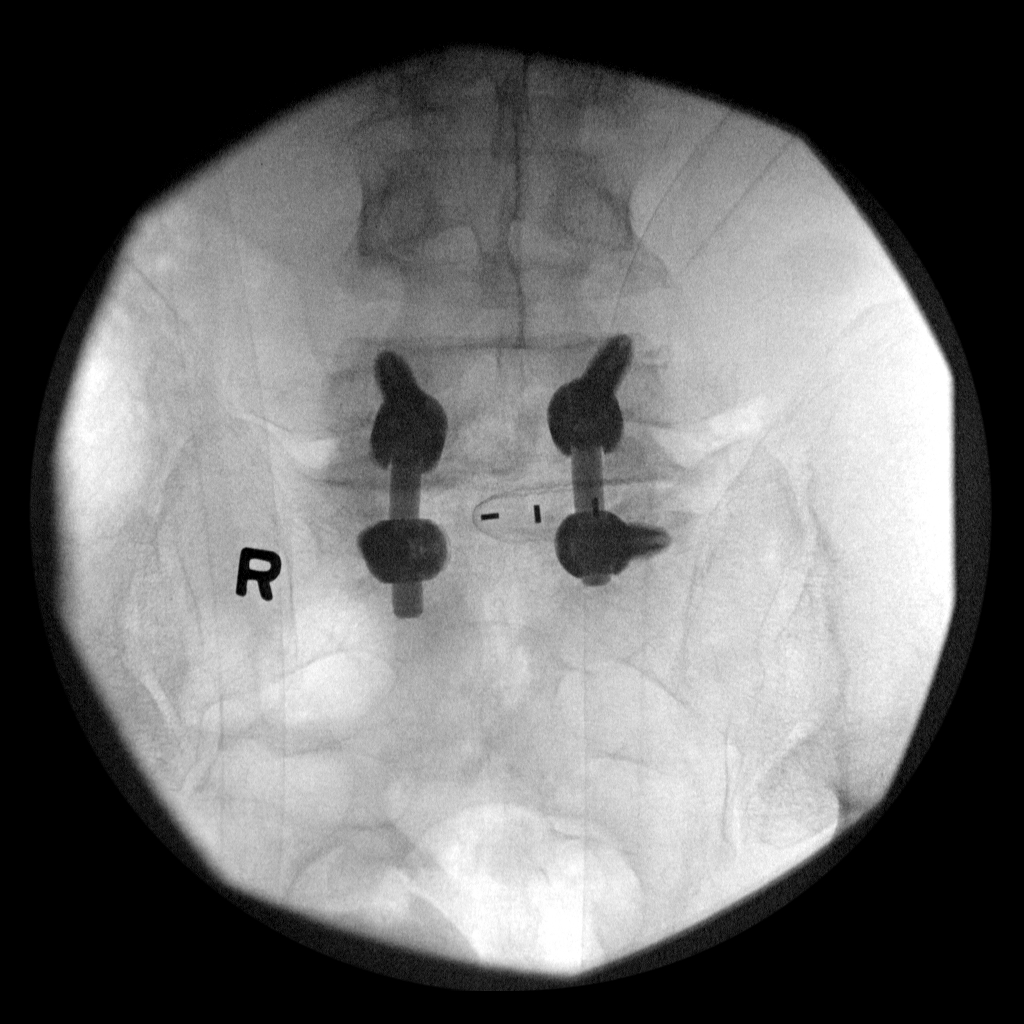
[im 3/4]
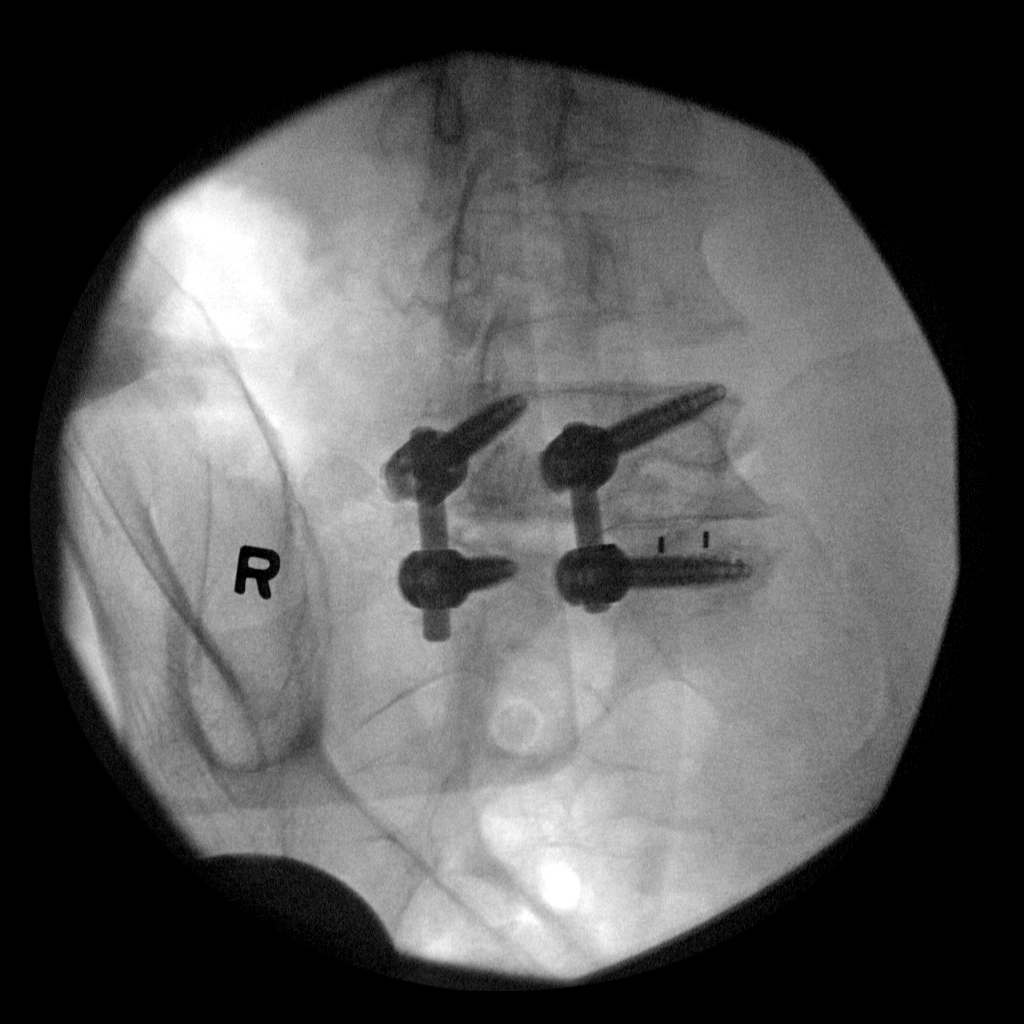
[im 4/4]
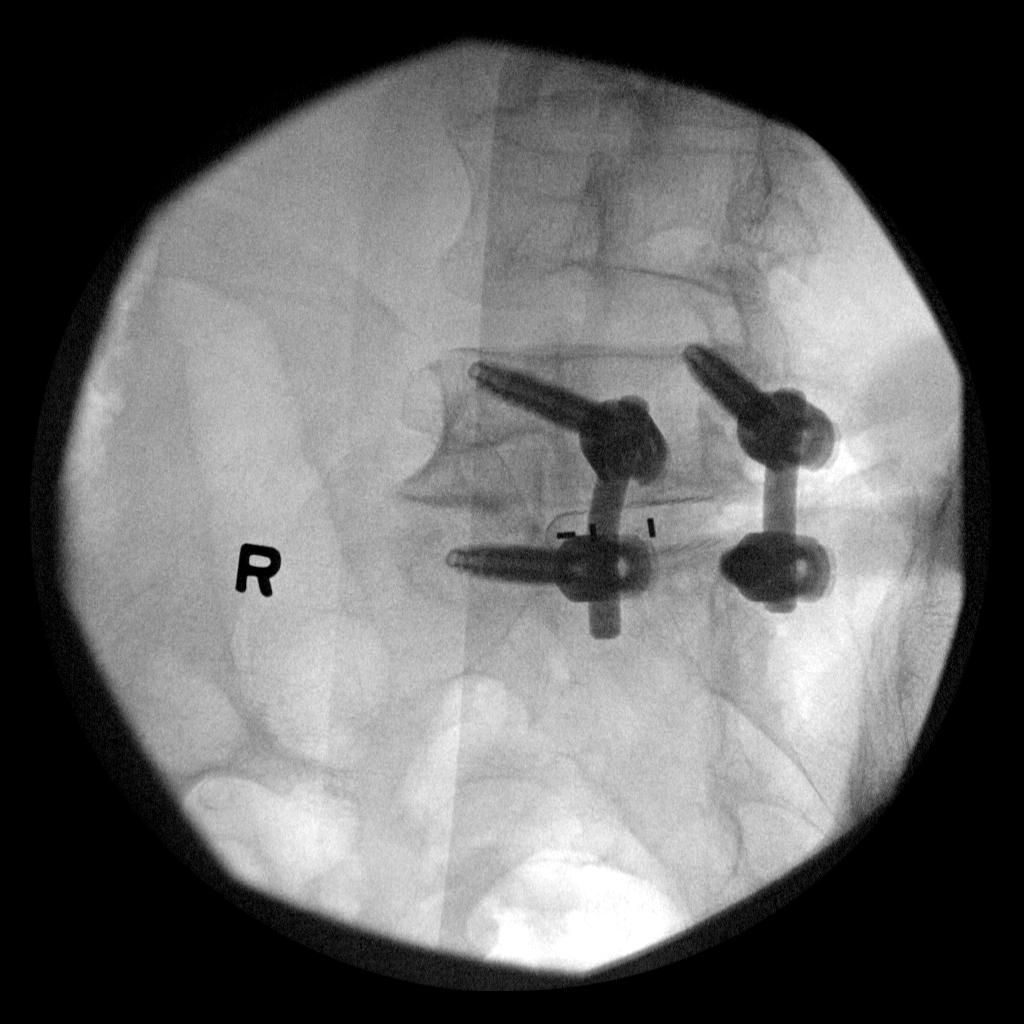

[4 of 4 positions shown; findings below may reference images not displayed]

FINDINGS: Four intraoperative fluoroscopic images of the lower lumbar spine
demonstrate the patient be status post surgical posterior fusion of
L5-S1 with bilateral intrapedicular screw placement. Good alignment
of vertebral bodies is noted. The end of the left-sided
intrapedicular screw of L5 appears to extend minimally beyond the
superior endplate of L5.
IMPRESSION: Surgical posterior fusion as described above. The end of the
left-sided intrapedicular screw of L5 does appear to extend
minimally beyond the superior endplate of L5.
